# Patient Record
Sex: Male | Born: 1970 | Race: Black or African American | Hispanic: No | Marital: Single | Smoking: Former smoker
Health system: Southern US, Community
[De-identification: ages and names within clinical notes are randomized; demographics above are authoritative.]

## PROBLEM LIST (undated history)

## (undated) DIAGNOSIS — E785 Hyperlipidemia, unspecified: Secondary | ICD-10-CM

## (undated) DIAGNOSIS — Z8701 Personal history of pneumonia (recurrent): Secondary | ICD-10-CM

## (undated) DIAGNOSIS — E669 Obesity, unspecified: Secondary | ICD-10-CM

## (undated) DIAGNOSIS — E119 Type 2 diabetes mellitus without complications: Secondary | ICD-10-CM

## (undated) DIAGNOSIS — I10 Essential (primary) hypertension: Secondary | ICD-10-CM

## (undated) DIAGNOSIS — Z87891 Personal history of nicotine dependence: Secondary | ICD-10-CM

## (undated) DIAGNOSIS — I2699 Other pulmonary embolism without acute cor pulmonale: Secondary | ICD-10-CM

## (undated) HISTORY — DX: Hyperlipidemia, unspecified: E78.5

## (undated) HISTORY — DX: Essential (primary) hypertension: I10

## (undated) HISTORY — PX: INCISION AND DRAINAGE: SHX5863

## (undated) HISTORY — DX: Type 2 diabetes mellitus without complications: E11.9

---

## 2014-06-07 ENCOUNTER — Ambulatory Visit: Payer: Self-pay | Admitting: Family Medicine

## 2014-06-26 ENCOUNTER — Ambulatory Visit
Admit: 2014-06-26 | Disposition: A | Discharge status: Home / Self Care | Payer: Self-pay | Attending: Family Medicine | Admitting: Family Medicine

## 2014-07-20 ENCOUNTER — Ambulatory Visit: Payer: Self-pay | Admitting: Cardiovascular Disease

## 2014-08-09 ENCOUNTER — Ambulatory Visit: Payer: Self-pay | Admitting: Cardiovascular Disease

## 2014-08-24 ENCOUNTER — Ambulatory Visit
Admit: 2014-08-24 | Disposition: A | Discharge status: Home / Self Care | Payer: Self-pay | Attending: Family Medicine | Admitting: Family Medicine

## 2014-09-14 ENCOUNTER — Ambulatory Visit: Payer: Self-pay | Admitting: *Deleted

## 2014-10-23 ENCOUNTER — Telehealth: Payer: Self-pay | Admitting: Family Medicine

## 2014-10-23 NOTE — Telephone Encounter (Signed)
Pt needs refill on Losartan and Metformin and strips. Pt has a Librarian, [employer]. Pt uses [facility] on [address]. Pt has appt sched on 11/13/14. Pt states that the Losartan has to actually be called in. Pt wants to be notified when the rx is put in. Ok to leave vm per pt.

## 2014-10-23 NOTE — Telephone Encounter (Signed)
Please have pt call pharmacy to send us a refill request

## 2014-10-24 NOTE — Telephone Encounter (Signed)
Pt contacted, but no answer. LVM stating to call the pharmacy for refill request.

## 2014-11-01 ENCOUNTER — Other Ambulatory Visit: Payer: Self-pay | Admitting: Family Medicine

## 2014-11-01 MED ORDER — LOSARTAN POTASSIUM-HCTZ 100-12.5 MG PO TABS: 1.0000 | ORAL_TABLET | Freq: Every day | ORAL | Status: DC

## 2014-11-01 MED ORDER — METFORMIN HCL 1000 MG PO TABS: 1000.0000 mg | ORAL_TABLET | Freq: Two times a day (BID) | ORAL | Status: DC

## 2014-11-01 NOTE — Telephone Encounter (Signed)
Spoke with pt refilled meds for 1 month

## 2014-11-13 ENCOUNTER — Encounter: Payer: Self-pay | Admitting: Family Medicine

## 2014-11-13 ENCOUNTER — Ambulatory Visit (INDEPENDENT_AMBULATORY_CARE_PROVIDER_SITE_OTHER): Payer: Medicaid Other | Admitting: Family Medicine

## 2014-11-13 VITALS — BP 140/80 | HR 87 | Temp 98.4°F | Resp 18 | Ht 72.0 in | Wt 323.8 lb

## 2014-11-13 DIAGNOSIS — I10 Essential (primary) hypertension: Secondary | ICD-10-CM | POA: Diagnosis not present

## 2014-11-13 DIAGNOSIS — E119 Type 2 diabetes mellitus without complications: Secondary | ICD-10-CM | POA: Diagnosis not present

## 2014-11-13 DIAGNOSIS — E785 Hyperlipidemia, unspecified: Secondary | ICD-10-CM

## 2014-11-13 DIAGNOSIS — IMO0002 Reserved for concepts with insufficient information to code with codable children: Secondary | ICD-10-CM | POA: Insufficient documentation

## 2014-11-13 MED ORDER — LOSARTAN POTASSIUM-HCTZ 100-12.5 MG PO TABS: 1.0000 | ORAL_TABLET | Freq: Every day | ORAL | Status: DC

## 2014-11-13 MED ORDER — METFORMIN HCL 1000 MG PO TABS: 1000.0000 mg | ORAL_TABLET | Freq: Two times a day (BID) | ORAL | Status: DC

## 2014-11-13 MED ORDER — ATORVASTATIN CALCIUM 10 MG PO TABS: 10.0000 mg | ORAL_TABLET | Freq: Every day | ORAL | Status: DC

## 2014-11-13 NOTE — Progress Notes (Signed)
Name: [name]   MRN: [id number]    DOB: Mar 30, 1971   Date:11/13/2014       Progress Note  Subjective  Chief Complaint  Chief Complaint  Patient presents with  . Follow-up    BP/ Glucose  . Hypertension  . Diabetes  . Hyperlipidemia    Hypertension This is a chronic problem. The problem is controlled. Pertinent negatives include no blurred vision or chest pain. Risk factors for coronary artery disease include dyslipidemia and obesity. Past treatments include diuretics and angiotensin blockers. The current treatment provides significant improvement. There is no history of angina, kidney disease, CAD/MI or CVA.  Diabetes He presents for his follow-up diabetic visit. He has type 2 diabetes mellitus. His disease course has been stable. Pertinent negatives for diabetes include no blurred vision, no chest pain, no fatigue, no polydipsia and no polyuria. Pertinent negatives for diabetic complications include no CVA. Current diabetic treatment includes oral agent (monotherapy). His weight is stable. He is following a diabetic and generally healthy diet. He has had a previous visit with a dietitian. His breakfast blood glucose is taken between 6-7 am. His breakfast blood glucose range is generally >200 mg/dl. An ACE inhibitor/angiotensin II receptor blocker is being taken. He does not see a podiatrist.Eye exam is not current.  Hyperlipidemia This is a chronic problem. The problem is uncontrolled. Recent lipid tests were reviewed and are high. Exacerbating diseases include diabetes and obesity. He has no history of liver disease. Pertinent negatives include no chest pain, leg pain or myalgias. Current antihyperlipidemic treatment includes statins. The current treatment provides significant improvement of lipids. Risk factors for coronary artery disease include male sex and obesity.      Past Medical History  Diagnosis Date  . Hyperlipidemia   . Hypertension   . Diabetes mellitus without  complication     History reviewed. No pertinent past surgical history.  Family History  Problem Relation Age of Onset  . Hypertension Mother   . Diabetes Mother   . Diabetes Maternal Grandmother     History   Social History  . Marital Status: Single    Spouse Name: N/A  . Number of Children: N/A  . Years of Education: N/A   Occupational History  . Not on file.   Social History Main Topics  . Smoking status: Current Some Day Smoker    Types: Cigarettes  . Smokeless tobacco: Never Used     Comment: occasional  . Alcohol Use: No  . Drug Use: No  . Sexual Activity:    Partners: Female   Other Topics Concern  . Not on file   Social History Narrative  . No narrative on file     Current outpatient prescriptions:  .  atorvastatin (LIPITOR) 10 MG tablet, Take by mouth., Disp: , Rfl:  .  HYDROcodone-acetaminophen (NORCO/VICODIN) 5-325 MG per tablet, Take by mouth., Disp: , Rfl:  .  losartan-hydrochlorothiazide (HYZAAR) 100-12.5 MG per tablet, Take 1 tablet by mouth daily., Disp: 30 tablet, Rfl: 0 .  metFORMIN (GLUCOPHAGE) 1000 MG tablet, Take 1 tablet (1,000 mg total) by mouth 2 (two) times daily with a meal., Disp: 60 tablet, Rfl: 0  No Known Allergies   Review of Systems  Constitutional: Negative for fatigue.  Eyes: Negative for blurred vision.  Cardiovascular: Negative for chest pain.  Musculoskeletal: Negative for myalgias.  Endo/Heme/Allergies: Negative for polydipsia.      Objective  Filed Vitals:   11/13/14 0823  BP: 140/80  Pulse: 87  Temp: 98.4 F (36.9 C)  TempSrc: Oral  Resp: 18  Height: 6' (1.829 m)  Weight: 323 lb 12.8 oz (146.875 kg)  SpO2: 96%    Physical Exam  Constitutional: He is well-developed, well-nourished, and in no distress.  Cardiovascular: Normal rate and regular rhythm.   Pulmonary/Chest: Effort normal and breath sounds normal.  Abdominal: Soft. Bowel sounds are normal.  Nursing note and vitals  reviewed.    Assessment & Plan 1. Essential hypertension Blood pressure now well controlled and at goal of less than 140/90. Continue present management - losartan-hydrochlorothiazide (HYZAAR) 100-12.5 MG per tablet; Take 1 tablet by mouth daily.  Dispense: 90 tablet; Refill: 0  2. Diabetes mellitus without complication Fasting blood glucose readings are still poorly controlled. Recheck A1c and consider addition of second oral agen - metFORMIN (GLUCOPHAGE) 1000 MG tablet; Take 1 tablet (1,000 mg total) by mouth 2 (two) times daily with a meal.  Dispense: 180 tablet; Refill: 1 - HgB A1c - Urine Microalbumin w/creat. ratio - Ambulatory referral to Podiatry  3. Hyperlipidemia Recheck lipid panel and if not at goal, consider increasing the dosage of Lipitor - atorvastatin (LIPITOR) 10 MG tablet; Take 1 tablet (10 mg total) by mouth daily at 6 PM.  Dispense: 90 tablet; Refill: 0 - Lipid Profile - Comprehensive metabolic panel   Ulrick Methot Asad [facility] 11/13/2014 8:32 AM

## 2015-02-13 ENCOUNTER — Ambulatory Visit: Payer: Medicaid Other | Admitting: Family Medicine

## 2015-03-15 ENCOUNTER — Telehealth: Payer: Self-pay | Admitting: Family Medicine

## 2015-03-15 DIAGNOSIS — I10 Essential (primary) hypertension: Secondary | ICD-10-CM

## 2015-03-15 DIAGNOSIS — E785 Hyperlipidemia, unspecified: Secondary | ICD-10-CM

## 2015-03-15 DIAGNOSIS — E119 Type 2 diabetes mellitus without complications: Secondary | ICD-10-CM

## 2015-03-15 MED ORDER — METFORMIN HCL 1000 MG PO TABS: 1000.0000 mg | ORAL_TABLET | Freq: Two times a day (BID) | ORAL | Status: DC

## 2015-03-15 MED ORDER — LOSARTAN POTASSIUM-HCTZ 100-12.5 MG PO TABS: 1.0000 | ORAL_TABLET | Freq: Every day | ORAL | Status: DC

## 2015-03-15 MED ORDER — ATORVASTATIN CALCIUM 10 MG PO TABS: 10.0000 mg | ORAL_TABLET | Freq: Every day | ORAL | Status: DC

## 2015-03-15 NOTE — Telephone Encounter (Signed)
Pt would [facility] [address] Pt was last seen in July and does not have a future appt and was advised to schedule an appt and pt refused to due to his work schedule. Please advise.

## 2015-03-15 NOTE — Telephone Encounter (Signed)
[facility] [address] patient has appointment scheduled for 04/12/2015

## 2015-04-12 ENCOUNTER — Encounter: Payer: Self-pay | Admitting: Family Medicine

## 2015-04-12 ENCOUNTER — Ambulatory Visit (INDEPENDENT_AMBULATORY_CARE_PROVIDER_SITE_OTHER): Payer: Medicaid Other | Admitting: Family Medicine

## 2015-04-12 VITALS — BP 138/78 | HR 80 | Temp 98.6°F | Resp 15 | Ht 72.0 in | Wt 339.8 lb

## 2015-04-12 DIAGNOSIS — I10 Essential (primary) hypertension: Secondary | ICD-10-CM | POA: Diagnosis not present

## 2015-04-12 DIAGNOSIS — E785 Hyperlipidemia, unspecified: Secondary | ICD-10-CM

## 2015-04-12 DIAGNOSIS — E119 Type 2 diabetes mellitus without complications: Secondary | ICD-10-CM | POA: Diagnosis not present

## 2015-04-12 LAB — GLUCOSE, POCT (MANUAL RESULT ENTRY): POC Glucose: 209 mg/dl — AB (ref 70–99)

## 2015-04-12 LAB — POCT GLYCOSYLATED HEMOGLOBIN (HGB A1C): Hemoglobin A1C: 8.2

## 2015-04-12 MED ORDER — LOSARTAN POTASSIUM-HCTZ 100-12.5 MG PO TABS: 1.0000 | ORAL_TABLET | Freq: Every day | ORAL | Status: DC

## 2015-04-12 NOTE — Progress Notes (Signed)
Name: [name]   MRN: [id number]    DOB: Feb 04, 1971   Date:04/12/2015       Progress Note  Subjective  Chief Complaint  Chief Complaint  Patient presents with  . Medication Refill    losartan   . Diabetes  . Hyperlipidemia  . Hypertension    Diabetes He presents for his follow-up diabetic visit. He has type 2 diabetes mellitus. His disease course has been stable. Pertinent negatives for hypoglycemia include no headaches or sweats. Pertinent negatives for diabetes include no blurred vision, no chest pain, no fatigue, no polydipsia and no polyuria. Symptoms are improving. Pertinent negatives for diabetic complications include no CVA. His weight is stable. Frequency home blood tests: Pt. not checking his Blood Glucose. An ACE inhibitor/angiotensin II receptor blocker is being taken.  Hyperlipidemia This is a chronic problem. The problem is uncontrolled. Recent lipid tests were reviewed and are high. Pertinent negatives include no chest pain or shortness of breath. Current antihyperlipidemic treatment includes statins.  Hypertension This is a chronic problem. The problem is unchanged. The problem is controlled. Pertinent negatives include no blurred vision, chest pain, headaches, palpitations, shortness of breath or sweats. Past treatments include angiotensin blockers and diuretics. There is no history of kidney disease, CAD/MI or CVA.     Past Medical History  Diagnosis Date  . Hyperlipidemia   . Hypertension   . Diabetes mellitus without complication (HCC)     History reviewed. No pertinent past surgical history.  Family History  Problem Relation Age of Onset  . Hypertension Mother   . Diabetes Mother   . Diabetes Maternal Grandmother     Social History   Social History  . Marital Status: Single    Spouse Name: N/A  . Number of Children: N/A  . Years of Education: N/A   Occupational History  . Not on file.   Social History Main Topics  . Smoking status:  Current Some Day Smoker    Types: Cigarettes  . Smokeless tobacco: Never Used     Comment: occasional  . Alcohol Use: No  . Drug Use: No  . Sexual Activity:    Partners: Female   Other Topics Concern  . Not on file   Social History Narrative     Current outpatient prescriptions:  .  atorvastatin (LIPITOR) 10 MG tablet, Take 1 tablet (10 mg total) by mouth daily at 6 PM., Disp: 30 tablet, Rfl: 0 .  HYDROcodone-acetaminophen (NORCO/VICODIN) 5-325 MG per tablet, Take by mouth. Reported on 04/12/2015, Disp: , Rfl:  .  losartan-hydrochlorothiazide (HYZAAR) 100-12.5 MG tablet, Take 1 tablet by mouth daily., Disp: 30 tablet, Rfl: 0 .  metFORMIN (GLUCOPHAGE) 1000 MG tablet, Take 1 tablet (1,000 mg total) by mouth 2 (two) times daily with a meal., Disp: 180 tablet, Rfl: 0  No Known Allergies   Review of Systems  Constitutional: Negative for fever, chills and fatigue.  Eyes: Negative for blurred vision.  Respiratory: Negative for shortness of breath.   Cardiovascular: Negative for chest pain and palpitations.  Neurological: Negative for headaches.  Endo/Heme/Allergies: Negative for polydipsia.    Objective  Filed Vitals:   04/12/15 0924  BP: 138/78  Pulse: 80  Temp: 98.6 F (37 C)  TempSrc: Oral  Resp: 15  Height: 6' (1.829 m)  Weight: 339 lb 12.8 oz (154.132 kg)  SpO2: 96%    Physical Exam  Constitutional: He is oriented to person, place, and time and well-developed, well-nourished, and in no distress.  HENT:  Head: Normocephalic and atraumatic.  Cardiovascular: Normal rate and regular rhythm.   No murmur heard. Pulmonary/Chest: Effort normal and breath sounds normal. He has no wheezes.  Abdominal: Soft. Bowel sounds are normal.  Neurological: He is alert and oriented to person, place, and time.  Nursing note and vitals reviewed.    Recent Results (from the past 2160 hour(s))  POCT HgB A1C     Status: Abnormal   Collection Time: 04/12/15  9:31 AM  Result Value  Ref Range   Hemoglobin A1C 8.2   POCT Glucose (CBG)     Status: Abnormal   Collection Time: 04/12/15  9:31 AM  Result Value Ref Range   POC Glucose 209 (A) 70 - 99 mg/dl     Assessment & Plan  1. Type 2 diabetes mellitus without complication, without long-term current use of insulin (HCC) Improving steadily. A1c down from 10.2% 8.2%. Advised to continue with current treatment plan. - POCT HgB A1C - POCT Glucose (CBG)  2. Essential hypertension  - losartan-hydrochlorothiazide (HYZAAR) 100-12.5 MG tablet; Take 1 tablet by mouth daily.  Dispense: 90 tablet; Refill: 0  3. Hyperlipidemia  - Lipid Profile    Chaela Branscum Asad A. [facility] 04/12/2015 9:40 AM

## 2015-07-12 ENCOUNTER — Ambulatory Visit: Payer: Medicaid Other | Admitting: Family Medicine

## 2015-08-05 ENCOUNTER — Ambulatory Visit: Payer: Medicaid Other | Admitting: Family Medicine

## 2016-08-07 ENCOUNTER — Ambulatory Visit (INDEPENDENT_AMBULATORY_CARE_PROVIDER_SITE_OTHER): Payer: BLUE CROSS/BLUE SHIELD | Admitting: Family Medicine

## 2016-08-07 ENCOUNTER — Encounter: Payer: Self-pay | Admitting: Family Medicine

## 2016-08-07 VITALS — BP 136/81 | HR 94 | Temp 97.7°F | Resp 17 | Ht 72.0 in | Wt 338.8 lb

## 2016-08-07 DIAGNOSIS — I10 Essential (primary) hypertension: Secondary | ICD-10-CM

## 2016-08-07 DIAGNOSIS — E782 Mixed hyperlipidemia: Secondary | ICD-10-CM | POA: Diagnosis not present

## 2016-08-07 DIAGNOSIS — E1165 Type 2 diabetes mellitus with hyperglycemia: Secondary | ICD-10-CM | POA: Diagnosis not present

## 2016-08-07 DIAGNOSIS — IMO0001 Reserved for inherently not codable concepts without codable children: Secondary | ICD-10-CM

## 2016-08-07 LAB — COMPLETE METABOLIC PANEL WITH GFR
ALT: 30 U/L (ref 9–46)
AST: 18 U/L (ref 10–40)
Albumin: 4.2 g/dL (ref 3.6–5.1)
Alkaline Phosphatase: 77 U/L (ref 40–115)
BILIRUBIN TOTAL: 0.3 mg/dL (ref 0.2–1.2)
BUN: 13 mg/dL (ref 7–25)
CO2: 28 mmol/L (ref 20–31)
CREATININE: 1.01 mg/dL (ref 0.60–1.35)
Calcium: 9.4 mg/dL (ref 8.6–10.3)
Chloride: 102 mmol/L (ref 98–110)
GFR, Est African American: >89 mL/min (ref >=60)
GFR, Est Non African American: >89 mL/min (ref >=60)
GLUCOSE: 186 mg/dL — AB (ref 65–99)
Potassium: 4.3 mmol/L (ref 3.5–5.3)
SODIUM: 136 mmol/L (ref 135–146)
TOTAL PROTEIN: 7.1 g/dL (ref 6.1–8.1)

## 2016-08-07 LAB — MICROALBUMIN / CREATININE URINE RATIO
CREATININE, URINE: 112 mg/dL (ref 20–370)
Microalb Creat Ratio: 13 mcg/mg creat (ref <30)
Microalb, Ur: 1.5 mg/dL

## 2016-08-07 LAB — POCT UA - MICROALBUMIN
ALBUMIN/CREATININE RATIO, URINE, POC: NEGATIVE
CREATININE, POC: NEGATIVE mg/dL
Microalbumin Ur, POC: NEGATIVE mg/L

## 2016-08-07 LAB — LIPID PANEL
Cholesterol: 165 mg/dL (ref <200)
HDL: 31 mg/dL — AB (ref >40)
LDL CALC: 78 mg/dL (ref <100)
Total CHOL/HDL Ratio: 5.3 Ratio — ABNORMAL HIGH (ref <5.0)
Triglycerides: 278 mg/dL — ABNORMAL HIGH (ref <150)
VLDL: 56 mg/dL — AB (ref <30)

## 2016-08-07 LAB — GLUCOSE, POCT (MANUAL RESULT ENTRY): POC GLUCOSE: 195 mg/dL — AB (ref 70–99)

## 2016-08-07 LAB — POCT GLYCOSYLATED HEMOGLOBIN (HGB A1C): Hemoglobin A1C: 9

## 2016-08-07 MED ORDER — LOSARTAN POTASSIUM 50 MG PO TABS: 50.0000 mg | ORAL_TABLET | Freq: Every day | ORAL | 0 refills | Status: DC

## 2016-08-07 MED ORDER — SITAGLIPTIN PHOS-METFORMIN HCL 50-500 MG PO TABS: 1.0000 | ORAL_TABLET | Freq: Two times a day (BID) | ORAL | 0 refills | Status: DC

## 2016-08-07 NOTE — Progress Notes (Signed)
Name: [name]   MRN: [id number]    DOB: 1970/07/19   Date:08/07/2016       Progress Note  Subjective  Chief Complaint  Chief Complaint  Patient presents with  . Annual Exam    CPE    Diabetes  He presents for his follow-up diabetic visit. He has type 2 diabetes mellitus. His disease course has been worsening. Pertinent negatives for hypoglycemia include no headaches or sweats. Associated symptoms include blurred vision (occasional blurry vision when working late at and looking at computer screen). Pertinent negatives for diabetes include no chest pain, no fatigue, no polydipsia and no polyuria. Pertinent negatives for diabetic complications include no CVA, heart disease or peripheral neuropathy. When asked about current treatments, none were reported. His weight is stable. He is following a generally healthy diet. Frequency home blood tests: does not check his blood glucose at home. An ACE inhibitor/angiotensin II receptor blocker is being taken.  Hyperlipidemia  This is a chronic problem. The problem is uncontrolled. Recent lipid tests were reviewed and are high. Pertinent negatives include no chest pain, leg pain, myalgias or shortness of breath. Current antihyperlipidemic treatment includes diet change. Risk factors for coronary artery disease include dyslipidemia.  Hypertension  This is a chronic problem. The problem is unchanged. The problem is controlled. Associated symptoms include blurred vision (occasional blurry vision when working late at and looking at computer screen). Pertinent negatives include no chest pain, headaches, orthopnea, palpitations, shortness of breath or sweats. Past treatments include nothing. There is no history of kidney disease, CAD/MI or CVA.   Patient has not been able to see [place] for a long time because of insurance difficulties, has not been taking his medications that were prescribed for diabetes and hypertension.  Past Medical History:  Diagnosis Date   . Diabetes mellitus without complication (HCC)   . Hyperlipidemia   . Hypertension     History reviewed. No pertinent surgical history.  Family History  Problem Relation Age of Onset  . Hypertension Mother   . Diabetes Mother   . Diabetes Maternal Grandmother     Social History   Social History  . Marital status: Single    Spouse name: N/A  . Number of children: N/A  . Years of education: N/A   Occupational History  . Not on file.   Social History Main Topics  . Smoking status: Current Some Day Smoker    Types: Cigarettes  . Smokeless tobacco: Never Used     Comment: occasional  . Alcohol use No  . Drug use: No  . Sexual activity: Yes    Partners: Female   Other Topics Concern  . Not on file   Social History Narrative  . No narrative on file     Current Outpatient Prescriptions:  .  atorvastatin (LIPITOR) 10 MG tablet, Take 1 tablet (10 mg total) by mouth daily at 6 PM. (Patient not taking: Reported on 08/07/2016), Disp: 30 tablet, Rfl: 0 .  HYDROcodone-acetaminophen (NORCO/VICODIN) 5-325 MG per tablet, Take by mouth. Reported on 04/12/2015, Disp: , Rfl:  .  losartan-hydrochlorothiazide (HYZAAR) 100-12.5 MG tablet, Take 1 tablet by mouth daily. (Patient not taking: Reported on 08/07/2016), Disp: 90 tablet, Rfl: 0 .  metFORMIN (GLUCOPHAGE) 1000 MG tablet, Take 1 tablet (1,000 mg total) by mouth 2 (two) times daily with a meal. (Patient not taking: Reported on 08/07/2016), Disp: 180 tablet, Rfl: 0  No Known Allergies   Review of Systems  Constitutional: Negative for fatigue.  Eyes: Positive for blurred vision (occasional blurry vision when working late at and looking at computer screen).  Respiratory: Negative for shortness of breath.   Cardiovascular: Negative for chest pain, palpitations and orthopnea.  Musculoskeletal: Negative for myalgias.  Neurological: Negative for headaches.  Endo/Heme/Allergies: Negative for polydipsia.     Objective  Vitals:    08/07/16 0916  BP: 136/81  Pulse: 94  Resp: 17  Temp: 97.7 F (36.5 C)  TempSrc: Oral  SpO2: 96%  Weight: (!) 338 lb 12.8 oz (153.7 kg)  Height: 6' (1.829 m)    Physical Exam  Constitutional: He is oriented to person, place, and time and well-developed, well-nourished, and in no distress.  HENT:  Head: Normocephalic and atraumatic.  Cardiovascular: Normal rate, regular rhythm and normal heart sounds.   No murmur heard. Pulmonary/Chest: Effort normal and breath sounds normal. He has no wheezes.  Abdominal: Soft. Bowel sounds are normal. There is no tenderness.  Musculoskeletal: He exhibits no edema.  Neurological: He is alert and oriented to person, place, and time.  Psychiatric: Mood, memory, affect and judgment normal.  Nursing note and vitals reviewed.      Recent Results (from the past 2160 hour(s))  POCT HgB A1C     Status: Abnormal   Collection Time: 08/07/16  9:43 AM  Result Value Ref Range   Hemoglobin A1C 9.0   POCT Glucose (CBG)     Status: Abnormal   Collection Time: 08/07/16  9:43 AM  Result Value Ref Range   POC Glucose 195 (A) 70 - 99 mg/dl  POCT UA - Microalbumin     Status: Normal   Collection Time: 08/07/16  9:45 AM  Result Value Ref Range   Microalbumin Ur, POC NEG mg/L   Creatinine, POC NEG mg/dL   Albumin/Creatinine Ratio, Urine, POC NEG      Assessment & Plan  1. Uncontrolled type 2 diabetes mellitus without complication, without long-term current use of insulin (HCC) Point-of-care A1c is 9%, poorly controlled diabetes, started on Janumet 50-500 milligrams twice a day, encouraged on dietary and lifestyle changes. - POCT HgB A1C - POCT Glucose (CBG) - POCT UA - Microalbumin - Urine Microalbumin w/creat. ratio - sitaGLIPtin-metformin (JANUMET) 50-500 MG tablet; Take 1 tablet by mouth 2 (two) times daily with a meal.  Dispense: 180 tablet; Refill: 0 - Ambulatory referral to Ophthalmology  2. Mixed hyperlipidemia Obtain FLP, consider starting  on statin therapy for primary prevention - COMPLETE METABOLIC PANEL WITH GFR - Lipid panel  3. Essential hypertension DC hydrochlorothiazide, was started on losartan 50 mg daily for renal protection and hypertension. Reassess in 3 months - losartan (COZAAR) 50 MG tablet; Take 1 tablet (50 mg total) by mouth daily.  Dispense: 90 tablet; Refill: 0   Cahlil Sattar [facility]ad [facility] 08/07/2016 10:04 AM

## 2016-08-19 ENCOUNTER — Telehealth: Payer: Self-pay

## 2016-08-19 MED ORDER — ROSUVASTATIN CALCIUM 5 MG PO TABS: 5.0000 mg | ORAL_TABLET | Freq: Every day | ORAL | 0 refills | Status: DC

## 2016-08-19 NOTE — Telephone Encounter (Signed)
Patient has been notified of lab results and a prescription for rosuvastatin 5 mg by mouth daily #90 has been sent to [facility] [address] per Dr. Sherryll Burger, patient has been notified and verbalized understanding

## 2016-08-27 ENCOUNTER — Encounter: Payer: Self-pay | Admitting: Family Medicine

## 2016-08-27 ENCOUNTER — Ambulatory Visit (INDEPENDENT_AMBULATORY_CARE_PROVIDER_SITE_OTHER): Payer: BLUE CROSS/BLUE SHIELD | Admitting: Family Medicine

## 2016-08-27 DIAGNOSIS — Z0000 Encounter for general adult medical examination without abnormal findings: Secondary | ICD-10-CM | POA: Insufficient documentation

## 2016-08-27 LAB — CBC WITH DIFFERENTIAL/PLATELET
BASOS PCT: 0 %
Basophils Absolute: 0 cells/uL (ref 0–200)
EOS ABS: 132 {cells}/uL (ref 15–500)
Eosinophils Relative: 2 %
HEMATOCRIT: 40.5 % (ref 38.5–50.0)
Hemoglobin: 13.5 g/dL (ref 13.2–17.1)
LYMPHS ABS: 2970 {cells}/uL (ref 850–3900)
Lymphocytes Relative: 45 %
MCH: 27.3 pg (ref 27.0–33.0)
MCHC: 33.3 g/dL (ref 32.0–36.0)
MCV: 81.8 fL (ref 80.0–100.0)
MONO ABS: 396 {cells}/uL (ref 200–950)
MPV: 9.2 fL (ref 7.5–12.5)
Monocytes Relative: 6 %
NEUTROS ABS: 3102 {cells}/uL (ref 1500–7800)
Neutrophils Relative %: 47 %
PLATELETS: 239 10*3/uL (ref 140–400)
RBC: 4.95 MIL/uL (ref 4.20–5.80)
RDW: 17 % — AB (ref 11.0–15.0)
WBC: 6.6 10*3/uL (ref 3.8–10.8)

## 2016-08-27 LAB — TSH: TSH: 1.9 mIU/L (ref 0.40–4.50)

## 2016-08-27 NOTE — Progress Notes (Signed)
Name: [name]   MRN: [id number]    DOB: 07/20/1970   Date:08/27/2016       Progress Note  Subjective  Chief Complaint  Chief Complaint  Patient presents with  . Follow-up    2 wk    HPI  Pt. Presents for Comprehensive Physical Exam.  He is not due for prostate or colorectal cancer screening at this time  Past Medical History:  Diagnosis Date  . Diabetes mellitus without complication (HCC)   . Hyperlipidemia   . Hypertension     History reviewed. No pertinent surgical history.  Family History  Problem Relation Age of Onset  . Hypertension Mother   . Diabetes Mother   . Diabetes Maternal Grandmother     Social History   Social History  . Marital status: Single    Spouse name: N/A  . Number of children: N/A  . Years of education: N/A   Occupational History  . Not on file.   Social History Main Topics  . Smoking status: Current Some Day Smoker    Types: Cigarettes  . Smokeless tobacco: Never Used     Comment: occasional  . Alcohol use No  . Drug use: No  . Sexual activity: Yes    Partners: Female   Other Topics Concern  . Not on file   Social History Narrative  . No narrative on file     Current Outpatient Prescriptions:  .  HYDROcodone-acetaminophen (NORCO/VICODIN) 5-325 MG per tablet, Take by mouth. Reported on 04/12/2015, Disp: , Rfl:  .  losartan (COZAAR) 50 MG tablet, Take 1 tablet (50 mg total) by mouth daily., Disp: 90 tablet, Rfl: 0 .  rosuvastatin (CRESTOR) 5 MG tablet, Take 1 tablet (5 mg total) by mouth at bedtime., Disp: 90 tablet, Rfl: 0 .  sitaGLIPtin-metformin (JANUMET) 50-500 MG tablet, Take 1 tablet by mouth 2 (two) times daily with a meal., Disp: 180 tablet, Rfl: 0  No Known Allergies   Review of Systems  Constitutional: Positive for malaise/fatigue. Negative for chills, fever and weight loss.  HENT: Negative for congestion and ear pain (sometimes right ear feels full).   Eyes: Negative for blurred vision and double  vision.  Respiratory: Negative for cough and shortness of breath.   Cardiovascular: Negative for chest pain, palpitations and leg swelling.  Gastrointestinal: Positive for constipation (occasional constipation.). Negative for blood in stool, diarrhea, nausea and vomiting.  Genitourinary: Negative for frequency, hematuria and urgency.  Musculoskeletal: Negative for back pain and neck pain.  Neurological: Negative for dizziness and headaches.  Psychiatric/Behavioral: Negative for depression. The patient is not nervous/anxious.      Objective  Vitals:   08/27/16 0933  BP: 138/80  Pulse: (!) 120  Resp: 17  Temp: 98.3 F (36.8 C)  TempSrc: Oral  SpO2: 96%  Weight: (!) 343 lb (155.6 kg)  Height: 6' (1.829 m)    Physical Exam  Constitutional: He is oriented to person, place, and time and well-developed, well-nourished, and in no distress.  HENT:  Head: Normocephalic and atraumatic.  Eyes: Pupils are equal, round, and reactive to light.  Neck: Neck supple.  Cardiovascular: Normal rate, regular rhythm and normal heart sounds.   No murmur heard. Pulmonary/Chest: Effort normal and breath sounds normal. He has no wheezes.  Abdominal: Soft. Bowel sounds are normal. There is no tenderness.  Musculoskeletal: He exhibits no edema.  Neurological: He is alert and oriented to person, place, and time.  Psychiatric: Mood, memory, affect and judgment normal.  Nursing note and vitals reviewed.     Assessment & Plan  1. Annual physical exam Obtain age-appropriate telemetry screenings - CBC with Differential/Platelet - TSH - VITAMIN D 25 Hydroxy (Vit-D Deficiency, Fractures)   Sophina Mitten [facility]ad [facility] 08/27/2016 9:36 AM

## 2016-08-28 LAB — VITAMIN D 25 HYDROXY (VIT D DEFICIENCY, FRACTURES): Vit D, 25-Hydroxy: 21 ng/mL — ABNORMAL LOW (ref 30–100)

## 2016-09-01 ENCOUNTER — Telehealth: Payer: Self-pay

## 2016-09-01 MED ORDER — VITAMIN D (ERGOCALCIFEROL) 1.25 MG (50000 UNIT) PO CAPS: 50000.0000 [IU] | ORAL_CAPSULE | ORAL | 0 refills | Status: DC

## 2016-09-01 NOTE — Telephone Encounter (Signed)
Patient has been notified of lab results and a prescription of vitamin D3 50,000 units take 1 capsule once a week x12 weeks [facility] [address] per Dr. Rae HalstedShah,patient has been notified and verbalized understanding

## 2016-11-09 ENCOUNTER — Ambulatory Visit: Payer: BLUE CROSS/BLUE SHIELD | Admitting: Family Medicine

## 2016-11-13 ENCOUNTER — Ambulatory Visit: Payer: BLUE CROSS/BLUE SHIELD | Admitting: Family Medicine

## 2016-11-25 ENCOUNTER — Other Ambulatory Visit: Payer: Self-pay | Admitting: Family Medicine

## 2016-11-25 DIAGNOSIS — I10 Essential (primary) hypertension: Secondary | ICD-10-CM

## 2019-12-09 ENCOUNTER — Other Ambulatory Visit: Payer: Self-pay

## 2019-12-09 ENCOUNTER — Ambulatory Visit
Admission: RE | Admit: 2019-12-09 | Discharge: 2019-12-09 | Disposition: A | Discharge status: Home / Self Care | Payer: Commercial Managed Care - PPO | Source: Ambulatory Visit | Attending: Emergency Medicine | Admitting: Emergency Medicine

## 2019-12-09 VITALS — BP 171/92 | HR 72 | Temp 98.3°F | Resp 16 | Ht 72.0 in | Wt 300.0 lb

## 2019-12-09 DIAGNOSIS — R590 Localized enlarged lymph nodes: Secondary | ICD-10-CM

## 2019-12-09 DIAGNOSIS — I10 Essential (primary) hypertension: Secondary | ICD-10-CM

## 2019-12-09 DIAGNOSIS — J029 Acute pharyngitis, unspecified: Secondary | ICD-10-CM | POA: Diagnosis not present

## 2019-12-09 DIAGNOSIS — K149 Disease of tongue, unspecified: Secondary | ICD-10-CM

## 2019-12-09 LAB — GROUP A STREP BY PCR: Group A Strep by PCR: NOT DETECTED

## 2019-12-09 MED ORDER — MAGIC MOUTHWASH W/LIDOCAINE: 5.0000 mL | Freq: Four times a day (QID) | ORAL | 0 refills | Status: AC | PRN

## 2019-12-09 NOTE — ED Triage Notes (Signed)
Patient c/o soreness on the back of his tongue that started on Monday.  Patient also reports some soreness when he swallows.  Patient denies fevers.

## 2019-12-09 NOTE — Discharge Instructions (Addendum)
Recommend use Magic Mouthwash with Lidocaine solution- take 1 teaspoon every 6 hours as needed for pain and swelling. May use take oral Ibuprofen 600mg  every 6 hours as needed for swelling and pain. Follow-up with  ENT if symptoms do not improve within 3 to 4 days.

## 2019-12-11 NOTE — ED Provider Notes (Signed)
[facility]    CSN: [id number] Arrival date & time: 12/09/19  1419      History   Chief Complaint Chief Complaint  Patient presents with  . Sore Throat    HPI [name] is a 49 y.o. male.   49 year old male presents with sores and tenderness of his tongue and mouth that started about 5 days ago. He had eaten something hot and thought he had burned his tongue. He saw a raw, red spot in the middle posterior part of his tongue and noticed a harder area ?abscess next to the raw spot. He also noticed his tongue had a white coating on it which would brush off with toothbrushing. He continues to have tenderness in the area and has multiple questions regarding cause and treatments. He has also noticed random muscle cramps in his neck and legs. He denies any fever, nasal congestion, cough or GI symptoms. He has tried using hydrogen peroxide with minimal relief. He indicates that he eats lots of fruits and vegetables and should not be "low" on any vitamin or mineral. He was a former smoker. Other chronic health issues include HTN, hyperlipidemia, and type 2 DM but he has stopped taking all his medicaiton.   The history is provided by the patient.    Past Medical History:  Diagnosis Date  . Diabetes mellitus without complication (HCC)   . Hyperlipidemia   . Hypertension     Patient Active Problem List   Diagnosis Date Noted  . Annual physical exam 08/27/2016  . Hypertension 11/13/2014  . Diabetes mellitus without complication (HCC) 11/13/2014  . Hyperlipidemia 11/13/2014  . Body mass index of 60 or higher 11/13/2014    History reviewed. No pertinent surgical history.     Home Medications    Prior to Admission medications   Medication Sig Start Date End Date Taking? Authorizing Provider  magic mouthwash w/lidocaine SOLN Take 5 mLs by mouth 4 (four) times daily as needed for mouth pain. 12/09/19   Sudie Grumbling, NP  losartan (COZAAR) 50 MG tablet Take 1  tablet (50 mg total) by mouth daily. 08/07/16 12/09/19  Ellyn Hack, MD  rosuvastatin (CRESTOR) 5 MG tablet Take 1 tablet (5 mg total) by mouth at bedtime. 08/19/16 12/09/19  Ellyn Hack, MD  sitaGLIPtin-metformin (JANUMET) 50-500 MG tablet Take 1 tablet by mouth 2 (two) times daily with a meal. 08/07/16 12/09/19  Ellyn Hack, MD    Family History Family History  Problem Relation Age of Onset  . Hypertension Mother   . Diabetes Mother   . Diabetes Maternal Grandmother     Social History Social History   Tobacco Use  . Smoking status: Former Smoker    Types: Cigarettes  . Smokeless tobacco: Never Used  . Tobacco comment: occasional  Vaping Use  . Vaping Use: Never used  Substance Use Topics  . Alcohol use: No    Alcohol/week: 0.0 standard drinks  . Drug use: No     Allergies   Patient has no known allergies.   Review of Systems Review of Systems  Constitutional: Negative for activity change, appetite change, chills, fatigue and fever.  HENT: Positive for mouth sores and sore throat (sore tongue and back of throat). Negative for congestion, dental problem, ear pain, facial swelling, nosebleeds, postnasal drip, rhinorrhea, sinus pressure, sinus pain, sneezing and trouble swallowing.   Eyes: Negative for pain, discharge, redness and itching.  Respiratory: Negative for cough,  chest tightness, shortness of breath and wheezing.   Gastrointestinal: Negative for abdominal pain, diarrhea, nausea and vomiting.  Musculoskeletal: Positive for myalgias (neck and lower leg muscle cramps).  Skin: Negative for color change, rash and wound.  Allergic/Immunologic: Negative for environmental allergies, food allergies and immunocompromised state.  Neurological: Negative for dizziness, tremors, seizures, syncope, facial asymmetry, weakness, light-headedness, numbness and headaches.  Hematological: Negative for adenopathy. Does not bruise/bleed easily.     Physical Exam Triage  Vital Signs ED Triage Vitals  Enc Vitals Group     BP 12/09/19 1511 (!) 171/92     Pulse Rate 12/09/19 1511 72     Resp 12/09/19 1511 16     Temp 12/09/19 1511 98.3 F (36.8 C)     Temp Source 12/09/19 1511 Oral     SpO2 12/09/19 1511 98 %     Weight 12/09/19 1507 300 lb (136.1 kg)     Height 12/09/19 1507 6' (1.829 m)     Head Circumference --      Peak Flow --      Pain Score 12/09/19 1507 4     Pain Loc --      Pain Edu? --      Excl. in GC? --    No data found.  Updated Vital Signs BP (!) 171/92 (BP Location: Right Arm)   Pulse 72   Temp 98.3 F (36.8 C) (Oral)   Resp 16   Ht 6' (1.829 m)   Wt 300 lb (136.1 kg)   SpO2 98%   BMI 40.69 kg/m   Visual Acuity Right Eye Distance:   Left Eye Distance:   Bilateral Distance:    Right Eye Near:   Left Eye Near:    Bilateral Near:     Physical Exam Vitals and nursing note reviewed.  Constitutional:      General: He is awake. He is not in acute distress.    Appearance: He is well-developed, well-groomed and overweight. He is not ill-appearing.     Comments: He is sitting comfortably in the exam chair in no acute distress.   HENT:     Head: Normocephalic and atraumatic.     Right Ear: Hearing, tympanic membrane, ear canal and external ear normal.     Left Ear: Hearing, tympanic membrane, ear canal and external ear normal.     Nose: Nose normal.     Right Sinus: No maxillary sinus tenderness or frontal sinus tenderness.     Left Sinus: No maxillary sinus tenderness or frontal sinus tenderness.     Mouth/Throat:     Lips: Pink.     Mouth: Mucous membranes are moist. No oral lesions or angioedema.     Dentition: No gum lesions.     Tongue: Lesions present.     Palate: No mass.     Pharynx: Oropharynx is clear. Uvula midline. No pharyngeal swelling, oropharyngeal exudate, posterior oropharyngeal erythema or uvula swelling.      Comments: 38mm red, round area just right of midline in posterior area of tongue absent of  normal papilae. 29mm round area just lateral to red lesion with firmness and tenderness. No distinct abscess present. No drainage. Minimal white coating on tongue today. No bleeding. No other distinct sores present in mouth.  Posterior pharynx slightly red but no swelling or exudate.  Eyes:     Extraocular Movements: Extraocular movements intact.     Conjunctiva/sclera: Conjunctivae normal.  Cardiovascular:     Rate and Rhythm: Normal  rate and regular rhythm.  Pulmonary:     Effort: Pulmonary effort is normal.  Musculoskeletal:        General: No swelling. Normal range of motion.     Cervical back: Normal range of motion and neck supple. No rigidity.     Right lower leg: No edema.     Left lower leg: No edema.  Lymphadenopathy:     Head:     Right side of head: No submental, submandibular, preauricular or posterior auricular adenopathy.     Left side of head: No submental, submandibular, preauricular or posterior auricular adenopathy.     Cervical: Cervical adenopathy present.     Right cervical: No superficial or posterior cervical adenopathy.    Left cervical: Superficial cervical adenopathy present. No posterior cervical adenopathy.  Skin:    General: Skin is warm and dry.     Capillary Refill: Capillary refill takes less than 2 seconds.     Findings: No erythema or rash.  Neurological:     General: No focal deficit present.     Mental Status: He is alert and oriented to person, place, and time.  Psychiatric:        Attention and Perception: Attention normal.        Mood and Affect: Mood normal.        Speech: Speech normal.        Behavior: Behavior is cooperative.        Thought Content: Thought content normal.        Cognition and Memory: Cognition normal.      UC Treatments / Results  Labs (all labs ordered are listed, but only abnormal results are displayed) Labs Reviewed  GROUP A STREP BY PCR    EKG   Radiology No results found.  Procedures Procedures  (including critical care time)  Medications Ordered in UC Medications - No data to display  Initial Impression / Assessment and Plan / UC Course  I have reviewed the triage vital signs and the nursing notes.  Pertinent labs & imaging results that were available during my care of the patient were reviewed by me and considered in my medical decision making (see chart for details).    Reviewed with patient that he appears to have a small ulcer/irritation on his tongue with firmness next to the lesion. Doubt bacterial infection or abscess. Discussed may be trauma, viral and/or may have a fungal component to symptoms. Patient convinced he has a bacterial infection and needs antibiotics. Discussed that his strep test is negative and he has no distinct findings indicating a bacterial infection. Reviewed that lymph node swelling can be due to any irritation and does not indicate bacterial infection. Patient continued to ask multiple questions about etiology and appropriate treatment. He was disappointed that we do not do biopsies at Urgent Care. Discussed that he will need to see an ENT for further evaluation and testing if symptoms persist. Recommend start Magic Mouthwash with lidocaine- 1 teaspoon swish and spit out 4 times a day as needed. May take oral Ibuprofen  every 6 hours as needed for swelling and pain. Recommend call [facility] in 3 to 4 days if not improving.  Final Clinical Impressions(s) / UC Diagnoses   Final diagnoses:  Tongue irritation  Left cervical lymphadenopathy  Sore throat  Elevated blood pressure reading with diagnosis of hypertension     Discharge Instructions     Recommend use Magic Mouthwash with Lidocaine solution- take 1 teaspoon every 6 hours  as needed for pain and swelling. May use take oral Ibuprofen 600mg  every 6 hours as needed for swelling and pain. Follow-up with [facility] if symptoms do not improve within 3 to 4 days.     ED Prescriptions     Medication Sig Dispense Auth. Provider   magic mouthwash w/lidocaine SOLN Take 5 mLs by mouth 4 (four) times daily as needed for mouth pain. 120 mL , NP     PDMP not reviewed this encounter.   Sudie Grumbling, NP 12/11/19 [phone]

## 2019-12-19 ENCOUNTER — Encounter: Payer: Self-pay | Admitting: Emergency Medicine

## 2019-12-19 ENCOUNTER — Emergency Department: Payer: Commercial Managed Care - PPO

## 2019-12-19 ENCOUNTER — Inpatient Hospital Stay
Admission: EM | Admit: 2019-12-19 | Discharge: 2020-01-03 | DRG: 177 | Disposition: A | Discharge status: Home / Self Care | Payer: Commercial Managed Care - PPO | Attending: Internal Medicine | Admitting: Internal Medicine

## 2019-12-19 DIAGNOSIS — J1282 Pneumonia due to coronavirus disease 2019: Secondary | ICD-10-CM | POA: Diagnosis present

## 2019-12-19 DIAGNOSIS — I2699 Other pulmonary embolism without acute cor pulmonale: Secondary | ICD-10-CM | POA: Diagnosis present

## 2019-12-19 DIAGNOSIS — D72819 Decreased white blood cell count, unspecified: Secondary | ICD-10-CM | POA: Diagnosis present

## 2019-12-19 DIAGNOSIS — U071 COVID-19: Principal | ICD-10-CM | POA: Diagnosis present

## 2019-12-19 DIAGNOSIS — E876 Hypokalemia: Secondary | ICD-10-CM | POA: Diagnosis present

## 2019-12-19 DIAGNOSIS — I10 Essential (primary) hypertension: Secondary | ICD-10-CM | POA: Diagnosis present

## 2019-12-19 DIAGNOSIS — Z833 Family history of diabetes mellitus: Secondary | ICD-10-CM

## 2019-12-19 DIAGNOSIS — I2693 Single subsegmental pulmonary embolism without acute cor pulmonale: Secondary | ICD-10-CM | POA: Diagnosis not present

## 2019-12-19 DIAGNOSIS — J9600 Acute respiratory failure, unspecified whether with hypoxia or hypercapnia: Secondary | ICD-10-CM | POA: Diagnosis present

## 2019-12-19 DIAGNOSIS — B3781 Candidal esophagitis: Secondary | ICD-10-CM | POA: Diagnosis present

## 2019-12-19 DIAGNOSIS — Z79899 Other long term (current) drug therapy: Secondary | ICD-10-CM

## 2019-12-19 DIAGNOSIS — Z6841 Body Mass Index (BMI) 40.0 and over, adult: Secondary | ICD-10-CM

## 2019-12-19 DIAGNOSIS — Z87891 Personal history of nicotine dependence: Secondary | ICD-10-CM

## 2019-12-19 DIAGNOSIS — Z7984 Long term (current) use of oral hypoglycemic drugs: Secondary | ICD-10-CM

## 2019-12-19 DIAGNOSIS — B370 Candidal stomatitis: Secondary | ICD-10-CM | POA: Diagnosis present

## 2019-12-19 DIAGNOSIS — D735 Infarction of spleen: Secondary | ICD-10-CM | POA: Diagnosis present

## 2019-12-19 DIAGNOSIS — E871 Hypo-osmolality and hyponatremia: Secondary | ICD-10-CM | POA: Diagnosis present

## 2019-12-19 DIAGNOSIS — J9601 Acute respiratory failure with hypoxia: Secondary | ICD-10-CM

## 2019-12-19 DIAGNOSIS — E119 Type 2 diabetes mellitus without complications: Secondary | ICD-10-CM

## 2019-12-19 DIAGNOSIS — E1165 Type 2 diabetes mellitus with hyperglycemia: Secondary | ICD-10-CM | POA: Diagnosis not present

## 2019-12-19 DIAGNOSIS — J982 Interstitial emphysema: Secondary | ICD-10-CM | POA: Diagnosis not present

## 2019-12-19 DIAGNOSIS — E785 Hyperlipidemia, unspecified: Secondary | ICD-10-CM | POA: Diagnosis present

## 2019-12-19 DIAGNOSIS — K120 Recurrent oral aphthae: Secondary | ICD-10-CM

## 2019-12-19 DIAGNOSIS — Z8249 Family history of ischemic heart disease and other diseases of the circulatory system: Secondary | ICD-10-CM

## 2019-12-19 DIAGNOSIS — E869 Volume depletion, unspecified: Secondary | ICD-10-CM | POA: Diagnosis present

## 2019-12-19 DIAGNOSIS — R7989 Other specified abnormal findings of blood chemistry: Secondary | ICD-10-CM | POA: Diagnosis present

## 2019-12-19 DIAGNOSIS — I161 Hypertensive emergency: Secondary | ICD-10-CM | POA: Diagnosis present

## 2019-12-19 DIAGNOSIS — R040 Epistaxis: Secondary | ICD-10-CM | POA: Diagnosis not present

## 2019-12-19 DIAGNOSIS — R079 Chest pain, unspecified: Secondary | ICD-10-CM

## 2019-12-19 HISTORY — DX: COVID-19: U07.1

## 2019-12-19 HISTORY — DX: Obesity, unspecified: E66.9

## 2019-12-19 HISTORY — DX: Personal history of nicotine dependence: Z87.891

## 2019-12-19 LAB — SARS CORONAVIRUS 2 BY RT PCR (HOSPITAL ORDER, PERFORMED IN ~~LOC~~ HOSPITAL LAB): SARS Coronavirus 2: POSITIVE — AB

## 2019-12-19 MED ORDER — ACETAMINOPHEN 500 MG PO TABS
1000.0000 mg | ORAL_TABLET | Freq: Once | ORAL | Status: AC
Start: 2019-12-19 — End: 2019-12-19
  Administered 2019-12-19: 20:00:00 1000 mg via ORAL
  Filled 2019-12-19: qty 2

## 2019-12-19 NOTE — ED Triage Notes (Addendum)
Pt reports fever since last week, loss of taste, nonprod cough; tylenol taken last at 2pm

## 2019-12-19 NOTE — ED Notes (Signed)
Pt placed on 2Lnc due to 89/90% room air sats. Pt improved to 94%

## 2019-12-20 ENCOUNTER — Other Ambulatory Visit: Payer: Self-pay

## 2019-12-20 ENCOUNTER — Encounter: Payer: Self-pay | Admitting: Emergency Medicine

## 2019-12-20 DIAGNOSIS — U071 COVID-19: Secondary | ICD-10-CM | POA: Diagnosis present

## 2019-12-20 DIAGNOSIS — J1282 Pneumonia due to coronavirus disease 2019: Secondary | ICD-10-CM | POA: Diagnosis present

## 2019-12-20 DIAGNOSIS — I161 Hypertensive emergency: Secondary | ICD-10-CM | POA: Diagnosis present

## 2019-12-20 DIAGNOSIS — E871 Hypo-osmolality and hyponatremia: Secondary | ICD-10-CM | POA: Diagnosis present

## 2019-12-20 DIAGNOSIS — J982 Interstitial emphysema: Secondary | ICD-10-CM | POA: Diagnosis not present

## 2019-12-20 DIAGNOSIS — Z87891 Personal history of nicotine dependence: Secondary | ICD-10-CM | POA: Diagnosis not present

## 2019-12-20 DIAGNOSIS — E785 Hyperlipidemia, unspecified: Secondary | ICD-10-CM | POA: Diagnosis present

## 2019-12-20 DIAGNOSIS — B3781 Candidal esophagitis: Secondary | ICD-10-CM | POA: Diagnosis present

## 2019-12-20 DIAGNOSIS — D735 Infarction of spleen: Secondary | ICD-10-CM | POA: Diagnosis present

## 2019-12-20 DIAGNOSIS — E1165 Type 2 diabetes mellitus with hyperglycemia: Secondary | ICD-10-CM | POA: Diagnosis not present

## 2019-12-20 DIAGNOSIS — B370 Candidal stomatitis: Secondary | ICD-10-CM | POA: Diagnosis present

## 2019-12-20 DIAGNOSIS — Z79899 Other long term (current) drug therapy: Secondary | ICD-10-CM | POA: Diagnosis not present

## 2019-12-20 DIAGNOSIS — J9601 Acute respiratory failure with hypoxia: Secondary | ICD-10-CM | POA: Diagnosis present

## 2019-12-20 DIAGNOSIS — Z7984 Long term (current) use of oral hypoglycemic drugs: Secondary | ICD-10-CM | POA: Diagnosis not present

## 2019-12-20 DIAGNOSIS — I10 Essential (primary) hypertension: Secondary | ICD-10-CM | POA: Diagnosis present

## 2019-12-20 DIAGNOSIS — R7989 Other specified abnormal findings of blood chemistry: Secondary | ICD-10-CM | POA: Diagnosis present

## 2019-12-20 DIAGNOSIS — E876 Hypokalemia: Secondary | ICD-10-CM | POA: Diagnosis present

## 2019-12-20 DIAGNOSIS — R040 Epistaxis: Secondary | ICD-10-CM | POA: Diagnosis not present

## 2019-12-20 DIAGNOSIS — E66813 Obesity, class 3: Secondary | ICD-10-CM

## 2019-12-20 DIAGNOSIS — K120 Recurrent oral aphthae: Secondary | ICD-10-CM | POA: Diagnosis present

## 2019-12-20 DIAGNOSIS — E869 Volume depletion, unspecified: Secondary | ICD-10-CM | POA: Diagnosis present

## 2019-12-20 DIAGNOSIS — D72819 Decreased white blood cell count, unspecified: Secondary | ICD-10-CM | POA: Diagnosis present

## 2019-12-20 DIAGNOSIS — Z6841 Body Mass Index (BMI) 40.0 and over, adult: Secondary | ICD-10-CM | POA: Diagnosis not present

## 2019-12-20 DIAGNOSIS — E119 Type 2 diabetes mellitus without complications: Secondary | ICD-10-CM | POA: Diagnosis not present

## 2019-12-20 DIAGNOSIS — I2693 Single subsegmental pulmonary embolism without acute cor pulmonale: Secondary | ICD-10-CM | POA: Diagnosis not present

## 2019-12-20 LAB — CBC WITH DIFFERENTIAL/PLATELET
Abs Immature Granulocytes: 0.02 10*3/uL (ref 0.00–0.07)
Basophils Absolute: 0 10*3/uL (ref 0.0–0.1)
Basophils Relative: 0 %
Eosinophils Absolute: 0 10*3/uL (ref 0.0–0.5)
Eosinophils Relative: 0 %
HCT: 39.8 % (ref 39.0–52.0)
Hemoglobin: 13.5 g/dL (ref 13.0–17.0)
Immature Granulocytes: 1 %
Lymphocytes Relative: 15 %
Lymphs Abs: 0.5 10*3/uL — ABNORMAL LOW (ref 0.7–4.0)
MCH: 28.1 pg (ref 26.0–34.0)
MCHC: 33.9 g/dL (ref 30.0–36.0)
MCV: 82.7 fL (ref 80.0–100.0)
Monocytes Absolute: 0.1 10*3/uL (ref 0.1–1.0)
Monocytes Relative: 2 %
Neutro Abs: 2.9 10*3/uL (ref 1.7–7.7)
Neutrophils Relative %: 82 %
Platelets: 127 10*3/uL — ABNORMAL LOW (ref 150–400)
RBC: 4.81 MIL/uL (ref 4.22–5.81)
RDW: 15 % (ref 11.5–15.5)
WBC: 3.6 10*3/uL — ABNORMAL LOW (ref 4.0–10.5)
nRBC: 0 % (ref 0.0–0.2)

## 2019-12-20 LAB — MAGNESIUM: Magnesium: 2 mg/dL (ref 1.7–2.4)

## 2019-12-20 LAB — URINE DRUG SCREEN, QUALITATIVE (ARMC ONLY)
Amphetamines, Ur Screen: NOT DETECTED
Barbiturates, Ur Screen: NOT DETECTED
Benzodiazepine, Ur Scrn: NOT DETECTED
Cannabinoid 50 Ng, Ur ~~LOC~~: POSITIVE — AB
Cocaine Metabolite,Ur ~~LOC~~: NOT DETECTED
MDMA (Ecstasy)Ur Screen: NOT DETECTED
Methadone Scn, Ur: NOT DETECTED
Opiate, Ur Screen: NOT DETECTED
Phencyclidine (PCP) Ur S: NOT DETECTED
Tricyclic, Ur Screen: NOT DETECTED

## 2019-12-20 LAB — LIPASE, BLOOD: Lipase: 21 U/L (ref 11–51)

## 2019-12-20 LAB — LACTIC ACID, PLASMA: Lactic Acid, Venous: 1.1 mmol/L (ref 0.5–1.9)

## 2019-12-20 LAB — GLUCOSE, CAPILLARY
Glucose-Capillary: 203 mg/dL — ABNORMAL HIGH (ref 70–99)
Glucose-Capillary: 255 mg/dL — ABNORMAL HIGH (ref 70–99)
Glucose-Capillary: 278 mg/dL — ABNORMAL HIGH (ref 70–99)
Glucose-Capillary: 291 mg/dL — ABNORMAL HIGH (ref 70–99)
Glucose-Capillary: 318 mg/dL — ABNORMAL HIGH (ref 70–99)

## 2019-12-20 LAB — PROTIME-INR
INR: 1 (ref 0.8–1.2)
Prothrombin Time: 12.8 seconds (ref 11.4–15.2)

## 2019-12-20 LAB — COMPREHENSIVE METABOLIC PANEL
ALT: 59 U/L — ABNORMAL HIGH (ref 0–44)
AST: 96 U/L — ABNORMAL HIGH (ref 15–41)
Albumin: 3.5 g/dL (ref 3.5–5.0)
Alkaline Phosphatase: 41 U/L (ref 38–126)
Anion gap: 11 (ref 5–15)
BUN: 9 mg/dL (ref 6–20)
CO2: 24 mmol/L (ref 22–32)
Calcium: 8 mg/dL — ABNORMAL LOW (ref 8.9–10.3)
Chloride: 100 mmol/L (ref 98–111)
Creatinine, Ser: 1.06 mg/dL (ref 0.61–1.24)
GFR calc Af Amer: >60 mL/min (ref >60)
GFR calc non Af Amer: >60 mL/min (ref >60)
Glucose, Bld: 203 mg/dL — ABNORMAL HIGH (ref 70–99)
Potassium: 3.3 mmol/L — ABNORMAL LOW (ref 3.5–5.1)
Sodium: 135 mmol/L (ref 135–145)
Total Bilirubin: 1.1 mg/dL (ref 0.3–1.2)
Total Protein: 6.9 g/dL (ref 6.5–8.1)

## 2019-12-20 LAB — MRSA PCR SCREENING: MRSA by PCR: NEGATIVE

## 2019-12-20 LAB — HIV ANTIBODY (ROUTINE TESTING W REFLEX): HIV Screen 4th Generation wRfx: NONREACTIVE

## 2019-12-20 LAB — BRAIN NATRIURETIC PEPTIDE: B Natriuretic Peptide: 22.3 pg/mL (ref 0.0–100.0)

## 2019-12-20 LAB — C-REACTIVE PROTEIN: CRP: 19.4 mg/dL — ABNORMAL HIGH (ref <1.0)

## 2019-12-20 LAB — PROCALCITONIN: Procalcitonin: 0.25 ng/mL

## 2019-12-20 LAB — ABO/RH: ABO/RH(D): O POS

## 2019-12-20 MED ORDER — METHYLPREDNISOLONE SODIUM SUCC 125 MG IJ SOLR
60.0000 mg | Freq: Two times a day (BID) | INTRAMUSCULAR | Status: DC
  Administered 2019-12-20 – 2019-12-26 (×13): 60 mg via INTRAVENOUS
  Filled 2019-12-20 (×13): qty 2

## 2019-12-20 MED ORDER — ADULT MULTIVITAMIN W/MINERALS CH
1.0000 | ORAL_TABLET | Freq: Every day | ORAL | Status: DC
  Administered 2019-12-20 – 2020-01-03 (×15): 1 via ORAL
  Filled 2019-12-20 (×16): qty 1

## 2019-12-20 MED ORDER — AMLODIPINE BESYLATE 5 MG PO TABS
5.0000 mg | ORAL_TABLET | Freq: Every day | ORAL | Status: DC
  Administered 2019-12-20 – 2019-12-27 (×8): 5 mg via ORAL
  Filled 2019-12-20 (×9): qty 1

## 2019-12-20 MED ORDER — LABETALOL HCL 5 MG/ML IV SOLN
5.0000 mg | INTRAVENOUS | Status: DC | PRN
  Administered 2019-12-20 – 2019-12-21 (×3): 5 mg via INTRAVENOUS
  Filled 2019-12-20 (×2): qty 4

## 2019-12-20 MED ORDER — ENOXAPARIN SODIUM 40 MG/0.4ML ~~LOC~~ SOLN
40.0000 mg | Freq: Two times a day (BID) | SUBCUTANEOUS | Status: DC
  Administered 2019-12-20 – 2019-12-27 (×16): 40 mg via SUBCUTANEOUS
  Filled 2019-12-20 (×16): qty 0.4

## 2019-12-20 MED ORDER — SODIUM CHLORIDE 0.9 % IV SOLN
100.0000 mg | Freq: Every day | INTRAVENOUS | Status: AC
  Administered 2019-12-21 – 2019-12-24 (×4): 100 mg via INTRAVENOUS
  Filled 2019-12-20: qty 100
  Filled 2019-12-20: qty 20
  Filled 2019-12-20 (×2): qty 100
  Filled 2019-12-20: qty 20

## 2019-12-20 MED ORDER — ONDANSETRON HCL 4 MG PO TABS: 4.0000 mg | ORAL_TABLET | Freq: Four times a day (QID) | ORAL | Status: DC | PRN

## 2019-12-20 MED ORDER — SODIUM CHLORIDE 0.9% FLUSH
10.0000 mL | Freq: Two times a day (BID) | INTRAVENOUS | Status: DC
  Administered 2019-12-20 – 2020-01-02 (×27): 10 mL via INTRAVENOUS

## 2019-12-20 MED ORDER — INSULIN ASPART 100 UNIT/ML ~~LOC~~ SOLN
0.0000 [IU] | Freq: Three times a day (TID) | SUBCUTANEOUS | Status: DC
  Administered 2019-12-20: 11 [IU] via SUBCUTANEOUS
  Administered 2019-12-20: 3 [IU] via SUBCUTANEOUS
  Administered 2019-12-20: 11 [IU] via SUBCUTANEOUS
  Administered 2019-12-21: 15 [IU] via SUBCUTANEOUS
  Administered 2019-12-21 (×2): 20 [IU] via SUBCUTANEOUS
  Administered 2019-12-22: 11 [IU] via SUBCUTANEOUS
  Administered 2019-12-22: 20 [IU] via SUBCUTANEOUS
  Administered 2019-12-22: 15 [IU] via SUBCUTANEOUS
  Administered 2019-12-23: 7 [IU] via SUBCUTANEOUS
  Administered 2019-12-23 – 2019-12-24 (×3): 11 [IU] via SUBCUTANEOUS
  Administered 2019-12-24: 7 [IU] via SUBCUTANEOUS
  Administered 2019-12-24: 11 [IU] via SUBCUTANEOUS
  Administered 2019-12-25 – 2019-12-26 (×6): 7 [IU] via SUBCUTANEOUS
  Administered 2019-12-27 (×2): 11 [IU] via SUBCUTANEOUS
  Administered 2019-12-27: 4 [IU] via SUBCUTANEOUS
  Administered 2019-12-28: 11 [IU] via SUBCUTANEOUS
  Administered 2019-12-28: 4 [IU] via SUBCUTANEOUS
  Administered 2019-12-28 – 2019-12-29 (×2): 11 [IU] via SUBCUTANEOUS
  Administered 2019-12-29: 4 [IU] via SUBCUTANEOUS
  Administered 2019-12-30: 7 [IU] via SUBCUTANEOUS
  Administered 2019-12-30 (×2): 4 [IU] via SUBCUTANEOUS
  Administered 2019-12-31 (×2): 11 [IU] via SUBCUTANEOUS
  Administered 2019-12-31 – 2020-01-01 (×2): 7 [IU] via SUBCUTANEOUS
  Administered 2020-01-01: 15 [IU] via SUBCUTANEOUS
  Administered 2020-01-02: 11 [IU] via SUBCUTANEOUS
  Administered 2020-01-03: 4 [IU] via SUBCUTANEOUS
  Filled 2019-12-20 (×37): qty 1

## 2019-12-20 MED ORDER — INSULIN ASPART 100 UNIT/ML ~~LOC~~ SOLN
0.0000 [IU] | Freq: Every day | SUBCUTANEOUS | Status: DC
  Administered 2019-12-20: 4 [IU] via SUBCUTANEOUS
  Administered 2019-12-20: 2 [IU] via SUBCUTANEOUS
  Administered 2019-12-21: 4 [IU] via SUBCUTANEOUS
  Administered 2019-12-22: 2 [IU] via SUBCUTANEOUS
  Administered 2019-12-27: 3 [IU] via SUBCUTANEOUS
  Administered 2019-12-29: 2 [IU] via SUBCUTANEOUS
  Administered 2019-12-30: 4 [IU] via SUBCUTANEOUS
  Filled 2019-12-20 (×8): qty 1

## 2019-12-20 MED ORDER — FUROSEMIDE 10 MG/ML IJ SOLN
40.0000 mg | Freq: Once | INTRAMUSCULAR | Status: AC
  Administered 2019-12-20: 40 mg via INTRAVENOUS
  Filled 2019-12-20: qty 4

## 2019-12-20 MED ORDER — SODIUM CHLORIDE 0.9 % IV SOLN
200.0000 mg | Freq: Once | INTRAVENOUS | Status: AC
  Administered 2019-12-20: 200 mg via INTRAVENOUS
  Filled 2019-12-20: qty 200

## 2019-12-20 MED ORDER — ASCORBIC ACID 500 MG PO TABS
500.0000 mg | ORAL_TABLET | Freq: Every day | ORAL | Status: DC
  Administered 2019-12-20 – 2020-01-03 (×15): 500 mg via ORAL
  Filled 2019-12-20 (×15): qty 1

## 2019-12-20 MED ORDER — DEXAMETHASONE SODIUM PHOSPHATE 10 MG/ML IJ SOLN: 6.0000 mg | INTRAMUSCULAR | Status: DC

## 2019-12-20 MED ORDER — HYDROCOD POLST-CPM POLST ER 10-8 MG/5ML PO SUER
5.0000 mL | Freq: Two times a day (BID) | ORAL | Status: DC | PRN
  Administered 2019-12-20 – 2020-01-02 (×8): 5 mL via ORAL
  Filled 2019-12-20 (×8): qty 5

## 2019-12-20 MED ORDER — DEXAMETHASONE SODIUM PHOSPHATE 10 MG/ML IJ SOLN
10.0000 mg | Freq: Once | INTRAMUSCULAR | Status: AC
  Administered 2019-12-20: 10 mg via INTRAVENOUS
  Filled 2019-12-20: qty 1

## 2019-12-20 MED ORDER — SODIUM CHLORIDE 0.9 % IV SOLN: 200.0000 mg | Freq: Once | INTRAVENOUS | Status: DC

## 2019-12-20 MED ORDER — LINAGLIPTIN 5 MG PO TABS
5.0000 mg | ORAL_TABLET | Freq: Every day | ORAL | Status: DC
  Administered 2019-12-20 – 2020-01-03 (×15): 5 mg via ORAL
  Filled 2019-12-20 (×16): qty 1

## 2019-12-20 MED ORDER — ZINC SULFATE 220 (50 ZN) MG PO CAPS
220.0000 mg | ORAL_CAPSULE | Freq: Every day | ORAL | Status: DC
  Administered 2019-12-20 – 2020-01-03 (×15): 220 mg via ORAL
  Filled 2019-12-20 (×15): qty 1

## 2019-12-20 MED ORDER — HYDRALAZINE HCL 25 MG PO TABS
25.0000 mg | ORAL_TABLET | Freq: Four times a day (QID) | ORAL | Status: DC | PRN
  Administered 2019-12-20 – 2019-12-28 (×3): 25 mg via ORAL
  Filled 2019-12-20 (×4): qty 1

## 2019-12-20 MED ORDER — ONDANSETRON HCL 4 MG/2ML IJ SOLN
4.0000 mg | Freq: Four times a day (QID) | INTRAMUSCULAR | Status: DC | PRN
  Administered 2019-12-25 – 2019-12-26 (×2): 4 mg via INTRAVENOUS
  Filled 2019-12-20 (×2): qty 2

## 2019-12-20 MED ORDER — ACETAMINOPHEN 325 MG PO TABS
650.0000 mg | ORAL_TABLET | Freq: Four times a day (QID) | ORAL | Status: DC | PRN
  Administered 2019-12-20 (×2): 650 mg via ORAL
  Filled 2019-12-20 (×3): qty 2

## 2019-12-20 MED ORDER — SODIUM CHLORIDE 0.9 % IV SOLN: 100.0000 mg | Freq: Every day | INTRAVENOUS | Status: DC

## 2019-12-20 MED ORDER — CLONIDINE HCL 0.1 MG PO TABS
0.1000 mg | ORAL_TABLET | Freq: Every day | ORAL | Status: DC
  Administered 2019-12-20 – 2019-12-25 (×6): 0.1 mg via ORAL
  Filled 2019-12-20 (×6): qty 1

## 2019-12-20 MED ORDER — POTASSIUM CHLORIDE CRYS ER 20 MEQ PO TBCR
40.0000 meq | EXTENDED_RELEASE_TABLET | Freq: Once | ORAL | Status: AC
  Administered 2019-12-20: 40 meq via ORAL
  Filled 2019-12-20: qty 2

## 2019-12-20 MED ORDER — BARICITINIB 2 MG PO TABS
4.0000 mg | ORAL_TABLET | Freq: Every day | ORAL | Status: AC
  Administered 2019-12-20 – 2020-01-02 (×14): 4 mg via ORAL
  Filled 2019-12-20 (×16): qty 2

## 2019-12-20 MED ORDER — SODIUM CHLORIDE 0.9 % IV BOLUS
1000.0000 mL | Freq: Once | INTRAVENOUS | Status: AC
  Administered 2019-12-20: 1000 mL via INTRAVENOUS

## 2019-12-20 MED ORDER — ALBUTEROL SULFATE HFA 108 (90 BASE) MCG/ACT IN AERS
2.0000 | INHALATION_SPRAY | Freq: Four times a day (QID) | RESPIRATORY_TRACT | Status: DC
  Administered 2019-12-20 – 2020-01-03 (×51): 2 via RESPIRATORY_TRACT
  Filled 2019-12-20: qty 6.7

## 2019-12-20 MED ORDER — HYDROCODONE-ACETAMINOPHEN 5-325 MG PO TABS: 1.0000 | ORAL_TABLET | ORAL | Status: DC | PRN

## 2019-12-20 MED ORDER — GUAIFENESIN-DM 100-10 MG/5ML PO SYRP
10.0000 mL | ORAL_SOLUTION | ORAL | Status: DC | PRN
  Administered 2019-12-20: 10 mL via ORAL
  Filled 2019-12-20 (×3): qty 10

## 2019-12-20 NOTE — ED Provider Notes (Signed)
[facility] Emergency Department Provider Note  ____________________________________________   First MD Initiated Contact with Patient 12/19/19 2349     (approximate)  I have reviewed the triage vital signs and the nursing notes.   HISTORY  Chief Complaint Fever    HPI [name] is a 49 y.o. male with medical history as listed below who presents for evaluation of about 1 week of cough, shortness of breath, fever, generalized body aches, decreased appetite, loss of smell, loss of taste, decreased oral intake, and general malaise and fatigue.  He has no specific known contacts for COVID-19 but notes that he is unvaccinated.  His wife has had similar but milder symptoms.  He reports that his symptoms have become severe including the cough or shortness of breath.  Exertion makes his symptoms worse and nothing in particular makes him feel better.  He has a sore on his tongue that is very painful that he went to urgent care about a week ago and has been using a Magic mouthwash but it does not seem to be helping.  It is contributing to his inability  to eat or drink enough.  He reports that he stopped smoking a few months ago.        Past Medical History:  Diagnosis Date  . Diabetes mellitus without complication (HCC)   . History of tobacco use   . Hyperlipidemia   . Hypertension   . Obesity     Patient Active Problem List   Diagnosis Date Noted  . Acute respiratory failure due to COVID-19 (HCC) 12/20/2019  . Obesity, Class III, BMI 40-49.9 (morbid obesity) (HCC) 12/20/2019  . Pneumonia due to COVID-19 virus 12/20/2019  . Annual physical exam 08/27/2016  . Hypertension 11/13/2014  . Diabetes mellitus without complication (HCC) 11/13/2014  . Hyperlipidemia 11/13/2014  . Body mass index of 60 or higher 11/13/2014    History reviewed. No pertinent surgical history.  Prior to Admission medications   Medication Sig Start Date End Date Taking?  Authorizing Provider  magic mouthwash w/lidocaine SOLN Take 5 mLs by mouth 4 (four) times daily as needed for mouth pain. 12/09/19   Sudie Grumbling, NP  losartan (COZAAR) 50 MG tablet Take 1 tablet (50 mg total) by mouth daily. 08/07/16 12/09/19  Ellyn Hack, MD  rosuvastatin (CRESTOR) 5 MG tablet Take 1 tablet (5 mg total) by mouth at bedtime. 08/19/16 12/09/19  Ellyn Hack, MD  sitaGLIPtin-metformin (JANUMET) 50-500 MG tablet Take 1 tablet by mouth 2 (two) times daily with a meal. 08/07/16 12/09/19  Ellyn Hack, MD    Allergies Patient has no known allergies.  Family History  Problem Relation Age of Onset  . Hypertension Mother   . Diabetes Mother   . Diabetes Maternal Grandmother     Social History Social History   Tobacco Use  . Smoking status: Former Smoker    Types: Cigarettes  . Smokeless tobacco: Never Used  . Tobacco comment: occasional  Vaping Use  . Vaping Use: Never used  Substance Use Topics  . Alcohol use: No    Alcohol/week: 0.0 standard drinks  . Drug use: No    Review of Systems Constitutional: +fever/chills Eyes: No visual changes. ENT: +sore throat.  Lesion on tongue.  Loss of smell and taste. Cardiovascular: Denies chest pain. Respiratory: Shortness of breath and cough Gastrointestinal: No abdominal pain.  Decreased appetite and oral intake.  Nausea, no vomiting.  No diarrhea.  No  constipation. Genitourinary: Negative for dysuria. Musculoskeletal: Generalized body aches.  Negative for neck pain.  Negative for back pain. Integumentary: Negative for rash. Neurological: No focal weakness or numbness.   ____________________________________________   PHYSICAL EXAM:  VITAL SIGNS: ED Triage Vitals  Enc Vitals Group     BP 12/19/19 1949 (!) 183/77     Pulse Rate 12/19/19 1949 87     Resp 12/19/19 1949 (!) 22     Temp 12/19/19 1949 (!) 102.2 F (39 C)     Temp Source 12/19/19 1949 Oral     SpO2 12/19/19 1949 97 %     Weight 12/19/19  1949 (!) 142.8 kg (314 lb 13.1 oz)     Height 12/19/19 1949 1.829 m (6')     Head Circumference --      Peak Flow --      Pain Score 12/19/19 2013 0     Pain Loc --      Pain Edu? --      Excl. in GC? --     Constitutional: Alert and oriented.  Eyes: Conjunctivae are normal.  Head: Atraumatic. Nose: No congestion/rhinnorhea. Mouth/Throat: Patient is wearing a mask.  On exam the mucous membranes are dry.  He has a crater-like lesion on his tongue that most likely is an aphthous ulcer although that is an unusual location on the top middle of his tongue.  However there is no indication that it is a bacterial infection or requires urgent intervention. Neck: No stridor.  No meningeal signs.   Cardiovascular: Normal rate, regular rhythm. Good peripheral circulation. Grossly normal heart sounds. Respiratory: Slight increased work of breathing, coarse breath sounds, frequent cough. Gastrointestinal: Soft and nontender. No distention.  Musculoskeletal: No lower extremity tenderness nor edema. No gross deformities of extremities. Neurologic:  Normal speech and language. No gross focal neurologic deficits are appreciated.  Skin:  Skin is warm, dry and intact. Psychiatric: Mood and affect are normal. Speech and behavior are normal.  ____________________________________________   LABS (all labs ordered are listed, but only abnormal results are displayed)  Labs Reviewed  SARS CORONAVIRUS 2 BY RT PCR (HOSPITAL ORDER, PERFORMED IN [facility]) - Abnormal; Notable for the following components:      Result Value   SARS Coronavirus 2 POSITIVE (*)    All other components within normal limits  GLUCOSE, CAPILLARY - Abnormal; Notable for the following components:   Glucose-Capillary 203 (*)    All other components within normal limits  CBC WITH DIFFERENTIAL/PLATELET - Abnormal; Notable for the following components:   WBC 3.6 (*)    Platelets 127 (*)    Lymphs Abs 0.5 (*)    All other  components within normal limits  COMPREHENSIVE METABOLIC PANEL - Abnormal; Notable for the following components:   Potassium 3.3 (*)    Glucose, Bld 203 (*)    Calcium 8.0 (*)    AST 96 (*)    ALT 59 (*)    All other components within normal limits  PROCALCITONIN  LACTIC ACID, PLASMA  PROTIME-INR  BRAIN NATRIURETIC PEPTIDE  LIPASE, BLOOD  HIV ANTIBODY (ROUTINE TESTING W REFLEX)  ABO/RH   ____________________________________________  EKG  No indication for emergent EKG ____________________________________________  RADIOLOGY Marylou MccoyI, Antoinett Dorman, personally viewed and evaluated these images (plain radiographs) as part of my medical decision making, as well as reviewing the written report by the radiologist.  ED MD interpretation:  COVID pneumonia.  Official radiology report(s): DG Chest 1 View  Result Date:  12/19/2019 CLINICAL DATA:  Fever, cough, COVID EXAM: CHEST  1 VIEW COMPARISON:  None. FINDINGS: Heart is normal size. Patchy bilateral airspace opacities. No effusions or pneumothorax. No acute bony abnormality. IMPRESSION: Patchy bilateral airspace disease compatible with COVID pneumonia. Electronically Signed   By: Charlett Nose M.D.   On: 12/19/2019 23:20    ____________________________________________   PROCEDURES   Procedure(s) performed (including Critical Care):  .Critical Care Performed by: Loleta Rose, MD Authorized by: Loleta Rose, MD   Critical care provider statement:    Critical care time (minutes):  30   Critical care time was exclusive of:  Separately billable procedures and treating other patients   Critical care was necessary to treat or prevent imminent or life-threatening deterioration of the following conditions:  Respiratory failure   Critical care was time spent personally by me on the following activities:  Development of treatment plan with patient or surrogate, discussions with consultants, evaluation of patient's response to treatment,  examination of patient, obtaining history from patient or surrogate, ordering and performing treatments and interventions, ordering and review of laboratory studies, ordering and review of radiographic studies, pulse oximetry, re-evaluation of patient's condition and review of old charts     ____________________________________________   INITIAL IMPRESSION / MDM / ASSESSMENT AND PLAN / ED COURSE  As part of my medical decision making, I reviewed the following data within the electronic MEDICAL RECORD NUMBER Nursing notes reviewed and incorporated, Labs reviewed , EKG interpreted , Old chart reviewed, Radiograph reviewed , Discussed with admitting physician and Notes from prior ED visits   Differential diagnosis includes, but is not limited to, COVID-19 pneumonia, bacterial /community-acquired pneumonia, PE, other nonspecific viral infection.  The patient tested positive for COVID-19 by PCR and has bilateral extensive opacities consistent with COVID-19/viral pneumonia.  His oxygen saturation was between 89% and 93% at rest.  We ambulated him and he dropped to 86% with a substantial increased work of breathing.  He is back in bed on 2 L of oxygen by nasal cannula.  Lab work is pending.  He will require admission and I am concerned given his comorbidities that he may get worse before he gets better but I will treat empirically with Decadron 10 mg IV and remdesivir.  The patient is not having chest pain and I think the probability of PE is very low and he does not need an emergent CTA chest at this time.  The patient understands and agrees with the plan.     Clinical Course as of Dec 19 356  Wed Dec 20, 2019  0124 Consulting hospitalist for admission.   [CF]  0133 Lactic Acid, Venous: 1.1 [CF]  0137 Mild transaminitis, slightly low potassium, otherwise unremarkable.  I will give potassium 40 mEq by mouth.  Comprehensive metabolic panel(!) [CF]  0140 Lipase: 21 [CF]  0148 Spoke by phone with Dr.  Para March with the hospitalist service. She will admit.   [CF]    Clinical Course User Index [CF] Loleta Rose, MD     ____________________________________________  FINAL CLINICAL IMPRESSION(S) / ED DIAGNOSES  Final diagnoses:  Lower respiratory tract infection due to COVID-19 virus  Acute respiratory failure with hypoxemia (HCC)  Aphthous ulcer of tongue  Volume depletion  Elevated LFTs     MEDICATIONS GIVEN DURING THIS VISIT:  Medications  remdesivir 200 mg in sodium chloride 0.9% 250 mL IVPB (0 mg Intravenous Stopped 12/20/19 0320)    Followed by  remdesivir 100 mg in sodium chloride 0.9 % 100  mL IVPB (has no administration in time range)  insulin aspart (novoLOG) injection 0-20 Units (has no administration in time range)  insulin aspart (novoLOG) injection 0-5 Units (2 Units Subcutaneous Given 12/20/19 0225)  linagliptin (TRADJENTA) tablet 5 mg (has no administration in time range)  enoxaparin (LOVENOX) injection 40 mg (40 mg Subcutaneous Given 12/20/19 0225)  albuterol (VENTOLIN HFA) 108 (90 Base) MCG/ACT inhaler 2 puff (has no administration in time range)  dexamethasone (DECADRON) injection 6 mg (has no administration in time range)  guaiFENesin-dextromethorphan (ROBITUSSIN DM) 100-10 MG/5ML syrup 10 mL (has no administration in time range)  chlorpheniramine-HYDROcodone (TUSSIONEX) 10-8 MG/5ML suspension 5 mL (has no administration in time range)  ascorbic acid (VITAMIN C) tablet 500 mg (has no administration in time range)  zinc sulfate capsule 220 mg (has no administration in time range)  acetaminophen (TYLENOL) tablet 650 mg (has no administration in time range)  ondansetron (ZOFRAN) tablet 4 mg (has no administration in time range)    Or  ondansetron (ZOFRAN) injection 4 mg (has no administration in time range)  HYDROcodone-acetaminophen (NORCO/VICODIN) 5-325 MG per tablet 1-2 tablet (has no administration in time range)  multivitamin with minerals tablet 1 tablet (has  no administration in time range)  baricitinib (OLUMIANT) tablet 4 mg (4 mg Oral Given 12/20/19 0253)  acetaminophen (TYLENOL) tablet 1,000 mg (1,000 mg Oral Given 12/19/19 2019)  dexamethasone (DECADRON) injection 10 mg (10 mg Intravenous Given 12/20/19 0111)  sodium chloride 0.9 % bolus 1,000 mL (0 mLs Intravenous Stopped 12/20/19 0320)  potassium chloride SA (KLOR-CON) CR tablet 40 mEq (40 mEq Oral Given 12/20/19 0158)     ED Discharge Orders    None      *Please note:  [name] was evaluated in Emergency Department on 12/20/2019 for the symptoms described in the history of present illness. He was evaluated in the context of the global COVID-19 pandemic, which necessitated consideration that the patient might be at risk for infection with the SARS-CoV-2 virus that causes COVID-19. Institutional protocols and algorithms that pertain to the evaluation of patients at risk for COVID-19 are in a state of rapid change based on information released by regulatory bodies including the CDC and federal and state organizations. These policies and algorithms were followed during the patient's care in the ED.  Some ED evaluations and interventions may be delayed as a result of limited staffing during and after the pandemic.*  Note:  This document was prepared using Dragon voice recognition software and may include unintentional dictation errors.   Loleta Rose, MD 12/20/19 [phone]

## 2019-12-20 NOTE — ED Notes (Signed)
Pt O2 increased to 6L [place]. Pt noted to be mouth breathing and heavy snoring when asleep. Placed O2 cannula in pt mouth to improve O2 sats while sleeping.

## 2019-12-20 NOTE — ED Notes (Signed)
MD Para March at bedside. Pt had coughing spell and O2 sats went to 83%. Pt spitting up blood and states "my throat feels raw".

## 2019-12-20 NOTE — ED Notes (Signed)
Pt placed on HFNC 13L. Pt O2 improved to 94% when awake.

## 2019-12-20 NOTE — ED Notes (Signed)
Nurse Katie informed of assigned bed

## 2019-12-20 NOTE — Plan of Care (Signed)
  Problem: Education: Goal: Knowledge of risk factors and measures for prevention of condition will improve Outcome: Progressing   

## 2019-12-20 NOTE — Progress Notes (Signed)
[facility] PROGRESS NOTE  Patient: [name] TLX:[id number]   PCP: Ellyn Hack, MD DOB: [birth year]   DOA: 12/19/2019   DOS: 12/20/2019    Subjective: Patient was admitted by my colleague earlier on 12/20/2019. I have reviewed the H&P as well as assessment and plan and agree with the same. Important changes in the plan are listed below.  Objective:  Vitals:   12/20/19 1524 12/20/19 1530  BP:  (!) 220/93  Pulse:    Resp: (!) 35 (!) 41  Temp:    SpO2:      Difficult to auscultate secondary to body habitus. S1-S2 present.  Assessment and plan: Hypertensive urgency Add clonidine, Norvasc, Lasix, hydralazine Check UDS to rule out cocaine.  Patient denies having any substance abuse.  Acute hypoxic respiratory failure secondary to COVID-19 pneumonia Continue current care. IV Lasix once. Patient is on IV remdesivir, steroids, baricitinib.  Changing IV Decadron to Solu-Medrol.  Possible OSA versus obesity hypoventilation syndrome Monitor for now. Outpatient work-up needed.  Hypokalemia. Treated. Monitor.  Elevated LFT. In the setting of COVID-19 prevention monitor.  Leukopenia. In the setting of COVID-19. Monitor.   Author: Lynden Oxford, MD [facility] 12/20/2019 4:11 PM   If 7PM-7AM, please contact night-coverage at [email or web address]

## 2019-12-20 NOTE — Progress Notes (Signed)
Remdesivir - Pharmacy Brief Note   O:  CXR: IMPRESSION: Patchy bilateral airspace disease compatible with COVID pneumonia. SpO2: 93 - 94% on 2L [place]   A/P:  Remdesivir 200 mg IVPB once followed by 100 mg IVPB daily x 4 days.   Thomasene Ripple, PharmD, BCPS Clinical Pharmacist 12/20/2019 12:51 AM

## 2019-12-20 NOTE — ED Notes (Signed)
Pt ambulated in room. Room air sats dropped to 86%. Pt back in bed, placed back on 2L. Now at 94%

## 2019-12-20 NOTE — H&P (Signed)
History and Physical    [name] VEL:[id number] DOB: [birth year] DOA: 12/19/2019  PCP: Scott Hack, MD   Patient coming from: Home  I have personally briefly reviewed patient's old medical records in [facility]  Chief Complaint: Cough, fever, shortness of breath  HPI: [name] is a 49 y.o. male with medical history significant for diabetes, hypertension and obesity, unvaccinated against Covid who presents to the emergency room with a several day history of fever cough and shortness of breath.  His wife has similar but milder symptoms.  Due to progressing symptoms came into the emergency room for evaluation ED Course: On arrival he was febrile at 102.2 tachypneic at 22 with O2 sat 97% on room air but desaturating to 86 with ambulation.  BP 183/77.  Covid test positive, chest x-ray showed patchy bilateral airspace disease compatible with Covid pneumonia.  Patient started on usual treatment, oxygen.  Hospitalist consulted for admission.  Review of Systems: As per HPI otherwise all other systems on review of systems negative.    Past Medical History:  Diagnosis Date  . COVID-19 12/19/2019   Diagnosed at [facility] on 12/19/2019  . Diabetes mellitus without complication (HCC)   . History of tobacco use   . Hyperlipidemia   . Hypertension   . Obesity     History reviewed. No pertinent surgical history.   reports that he has quit smoking. His smoking use included cigarettes. He has never used smokeless tobacco. He reports that he does not drink alcohol and does not use drugs.  No Known Allergies  Family History  Problem Relation Age of Onset  . Hypertension Mother   . Diabetes Mother   . Diabetes Maternal Grandmother       Prior to Admission medications   Medication Sig Start Date End Date Taking? Authorizing Provider  magic mouthwash w/lidocaine SOLN Take 5 mLs by mouth 4 (four) times daily as needed for mouth pain. 12/09/19   Scott Grumbling, NP  losartan  (COZAAR) 50 MG tablet Take 1 tablet (50 mg total) by mouth daily. 08/07/16 12/09/19  Scott Hack, MD  rosuvastatin (CRESTOR) 5 MG tablet Take 1 tablet (5 mg total) by mouth at bedtime. 08/19/16 12/09/19  Scott Hack, MD  sitaGLIPtin-metformin (JANUMET) 50-500 MG tablet Take 1 tablet by mouth 2 (two) times daily with a meal. 08/07/16 12/09/19  Scott Hack, MD    Physical Exam: Vitals:   12/19/19 1949 12/19/19 2225 12/20/19 0100  BP: (!) 183/77 (!) 160/84 (!) 159/89  Pulse: 87 90 83  Resp: (!) 22 (!) 21   Temp: (!) 102.2 F (39 C) 99.6 F (37.6 C)   TempSrc: Oral Oral   SpO2: 97% 93% 93%  Weight: (!) 142.8 kg    Height: 6' (1.829 m)       Vitals:   12/19/19 1949 12/19/19 2225 12/20/19 0100  BP: (!) 183/77 (!) 160/84 (!) 159/89  Pulse: 87 90 83  Resp: (!) 22 (!) 21   Temp: (!) 102.2 F (39 C) 99.6 F (37.6 C)   TempSrc: Oral Oral   SpO2: 97% 93% 93%  Weight: (!) 142.8 kg    Height: 6' (1.829 m)        Constitutional:  Patient looks ill, coughing vigorously, tachypneic and appears anxious  HEENT: Grossly normal but limited given Covid positivity  Cardiovascular:  Tachycardic. No murmurs, gallops, or rubs. 2+ symmetrical distal pulses are present . No JVD. No  LE edema Respiratory: Respiratory effort increased.Lungs sounds with scattered wheezes, crackles, or rhonchi.  Gastrointestinal: Soft, non tender, and non distended with positive bowel sounds. No rebound or guarding. Genitourinary: No CVA tenderness. Musculoskeletal: Nontender with normal range of motion in all extremities. No cyanosis, or erythema of extremities. Neurologic: Normal speech and language. Face is symmetric. Moving all extremities. No gross focal neurologic deficits . Skin: Skin is warm, dry.  No rash or ulcers Psychiatric: Anxious normal  Labs on Admission: I have personally reviewed following labs and imaging studies  CBC: Recent Labs  Lab 12/20/19 0109  WBC 3.6*  NEUTROABS 2.9  HGB  13.5  HCT 39.8  MCV 82.7  PLT 127*   Basic Metabolic Panel: Recent Labs  Lab 12/20/19 0109  NA 135  K 3.3*  CL 100  CO2 24  GLUCOSE 203*  BUN 9  CREATININE 1.06  CALCIUM 8.0*   GFR: Estimated Creatinine Clearance: 126.4 mL/min (by C-G formula based on SCr of 1.06 mg/dL). Liver Function Tests: Recent Labs  Lab 12/20/19 0109  AST 96*  ALT 59*  ALKPHOS 41  BILITOT 1.1  PROT 6.9  ALBUMIN 3.5   Recent Labs  Lab 12/20/19 0109  LIPASE 21   No results for input(s): AMMONIA in the last 168 hours. Coagulation Profile: Recent Labs  Lab 12/20/19 0109  INR 1.0   Cardiac Enzymes: No results for input(s): CKTOTAL, CKMB, CKMBINDEX, TROPONINI in the last 168 hours. BNP (last 3 results) No results for input(s): PROBNP in the last 8760 hours. HbA1C: No results for input(s): HGBA1C in the last 72 hours. CBG: Recent Labs  Lab 12/20/19 0033  GLUCAP 203*   Lipid Profile: No results for input(s): CHOL, HDL, LDLCALC, TRIG, CHOLHDL, LDLDIRECT in the last 72 hours. Thyroid Function Tests: No results for input(s): TSH, T4TOTAL, FREET4, T3FREE, THYROIDAB in the last 72 hours. Anemia Panel: No results for input(s): VITAMINB12, FOLATE, FERRITIN, TIBC, IRON, RETICCTPCT in the last 72 hours. Urine analysis: No results found for: COLORURINE, APPEARANCEUR, LABSPEC, PHURINE, GLUCOSEU, HGBUR, BILIRUBINUR, KETONESUR, PROTEINUR, UROBILINOGEN, NITRITE, LEUKOCYTESUR  Radiological Exams on Admission: DG Chest 1 View  Result Date: 12/19/2019 CLINICAL DATA:  Fever, cough, COVID EXAM: CHEST  1 VIEW COMPARISON:  None. FINDINGS: Heart is normal size. Patchy bilateral airspace opacities. No effusions or pneumothorax. No acute bony abnormality. IMPRESSION: Patchy bilateral airspace disease compatible with COVID pneumonia. Electronically Signed   By: Charlett Nose M.D.   On: 12/19/2019 23:20   EKG with sinus tachycardia  Assessment/Plan 49 year old male with history of diabetes, hypertension and  morbid obesity, unvaccinated against Covid who presents to the emergency room with a several day history of fever cough and shortness of breath.      Pneumonia due to COVID-19 virus, unvaccinated   Acute respiratory failure due to COVID-19 [facility]) -Patient with risk factors for severe Covid, presenting with fever, tachypnea,  O2 sats desaturations to 83% with coughing and movement in bed and Covid typical chest x-ray -Remdesivir, IV Decadron, albuterol, antitussives and vitamins -Baricitinib  Given comorbidities with associated risk for severe disease.  -Oxygen and proning as tolerated -Follow inflammatory biomarkers    Hypertension -Continue home meds pending med rec    Diabetes mellitus without complication (HCC) -Insulin sliding scale coverage and Tradjenta    Obesity, Class III, BMI 40-49.9 (morbid obesity) (HCC) -Complicating factor to overall prognosis and care    DVT prophylaxis: Lovenox  Code Status: full code  Family Communication:  none  Disposition Plan: Back to previous  home environment Consults called: none  Status:At the time of admission, it appears that the appropriate admission status for this patient is INPATIENT. This is judged to be reasonable and necessary in order to provide the required intensity of service to ensure the patient's safety given the presenting symptoms, physical exam findings, and initial radiographic and laboratory data in the context of their  Comorbid conditions.   Patient requires inpatient status due to high intensity of service, high risk for further deterioration and high frequency of surveillance required.   I certify that at the point of admission it is my clinical judgment that the patient will require inpatient hospital care spanning beyond 2 midnights     Andris Baumann MD [facility]     12/20/2019, 1:58 AM

## 2019-12-21 LAB — CBC WITH DIFFERENTIAL/PLATELET
Abs Immature Granulocytes: 0.02 10*3/uL (ref 0.00–0.07)
Basophils Absolute: 0 10*3/uL (ref 0.0–0.1)
Basophils Relative: 0 %
Eosinophils Absolute: 0 10*3/uL (ref 0.0–0.5)
Eosinophils Relative: 0 %
HCT: 44.1 % (ref 39.0–52.0)
Hemoglobin: 14.7 g/dL (ref 13.0–17.0)
Immature Granulocytes: 0 %
Lymphocytes Relative: 11 %
Lymphs Abs: 0.6 10*3/uL — ABNORMAL LOW (ref 0.7–4.0)
MCH: 27.5 pg (ref 26.0–34.0)
MCHC: 33.3 g/dL (ref 30.0–36.0)
MCV: 82.6 fL (ref 80.0–100.0)
Monocytes Absolute: 0.2 10*3/uL (ref 0.1–1.0)
Monocytes Relative: 3 %
Neutro Abs: 4.9 10*3/uL (ref 1.7–7.7)
Neutrophils Relative %: 86 %
Platelets: 159 10*3/uL (ref 150–400)
RBC: 5.34 MIL/uL (ref 4.22–5.81)
RDW: 15.4 % (ref 11.5–15.5)
Smear Review: NORMAL
WBC: 5.7 10*3/uL (ref 4.0–10.5)
nRBC: 0 % (ref 0.0–0.2)

## 2019-12-21 LAB — HEMOGLOBIN A1C
Hgb A1c MFr Bld: 10.4 % — ABNORMAL HIGH (ref 4.8–5.6)
Mean Plasma Glucose: 252 mg/dL

## 2019-12-21 LAB — GLUCOSE, CAPILLARY
Glucose-Capillary: 319 mg/dL — ABNORMAL HIGH (ref 70–99)
Glucose-Capillary: 410 mg/dL — ABNORMAL HIGH (ref 70–99)
Glucose-Capillary: 397 mg/dL — ABNORMAL HIGH (ref 70–99)
Glucose-Capillary: 325 mg/dL — ABNORMAL HIGH (ref 70–99)

## 2019-12-21 LAB — COMPREHENSIVE METABOLIC PANEL
ALT: 73 U/L — ABNORMAL HIGH (ref 0–44)
AST: 148 U/L — ABNORMAL HIGH (ref 15–41)
Albumin: 3.3 g/dL — ABNORMAL LOW (ref 3.5–5.0)
Alkaline Phosphatase: 53 U/L (ref 38–126)
Anion gap: 10 (ref 5–15)
BUN: 15 mg/dL (ref 6–20)
CO2: 24 mmol/L (ref 22–32)
Calcium: 8 mg/dL — ABNORMAL LOW (ref 8.9–10.3)
Chloride: 102 mmol/L (ref 98–111)
Creatinine, Ser: 0.91 mg/dL (ref 0.61–1.24)
GFR calc Af Amer: >60 mL/min (ref >60)
GFR calc non Af Amer: >60 mL/min (ref >60)
Glucose, Bld: 297 mg/dL — ABNORMAL HIGH (ref 70–99)
Potassium: 4 mmol/L (ref 3.5–5.1)
Sodium: 136 mmol/L (ref 135–145)
Total Bilirubin: 0.9 mg/dL (ref 0.3–1.2)
Total Protein: 6.9 g/dL (ref 6.5–8.1)

## 2019-12-21 LAB — FIBRIN DERIVATIVES D-DIMER (ARMC ONLY): Fibrin derivatives D-dimer (ARMC): 2720.97 ng/mL (FEU) — ABNORMAL HIGH (ref 0.00–499.00)

## 2019-12-21 LAB — FERRITIN: Ferritin: 4167 ng/mL — ABNORMAL HIGH (ref 24–336)

## 2019-12-21 LAB — MAGNESIUM: Magnesium: 2 mg/dL (ref 1.7–2.4)

## 2019-12-21 LAB — C-REACTIVE PROTEIN: CRP: 13.5 mg/dL — ABNORMAL HIGH (ref <1.0)

## 2019-12-21 MED ORDER — HYDROCHLOROTHIAZIDE 25 MG PO TABS
25.0000 mg | ORAL_TABLET | Freq: Every day | ORAL | Status: DC
  Administered 2019-12-21 – 2019-12-29 (×9): 25 mg via ORAL
  Filled 2019-12-21 (×9): qty 1

## 2019-12-21 MED ORDER — MELATONIN 5 MG PO TABS
5.0000 mg | ORAL_TABLET | Freq: Every day | ORAL | Status: DC
  Administered 2019-12-21 – 2020-01-01 (×12): 5 mg via ORAL
  Filled 2019-12-21 (×12): qty 1

## 2019-12-21 MED ORDER — CHLORHEXIDINE GLUCONATE 0.12 % MT SOLN
15.0000 mL | Freq: Two times a day (BID) | OROMUCOSAL | Status: DC
  Administered 2019-12-21 – 2020-01-03 (×21): 15 mL via OROMUCOSAL
  Filled 2019-12-21 (×24): qty 15

## 2019-12-21 MED ORDER — SPIRONOLACTONE 25 MG PO TABS
25.0000 mg | ORAL_TABLET | Freq: Every day | ORAL | Status: DC
  Administered 2019-12-21 – 2020-01-03 (×14): 25 mg via ORAL
  Filled 2019-12-21 (×14): qty 1

## 2019-12-21 MED ORDER — SPIRONOLACTONE-HCTZ 25-25 MG PO TABS
1.0000 | ORAL_TABLET | Freq: Every day | ORAL | Status: DC
  Filled 2019-12-21: qty 1

## 2019-12-21 MED ORDER — MAGIC MOUTHWASH W/LIDOCAINE: 15.0000 mL | Freq: Four times a day (QID) | ORAL | Status: DC

## 2019-12-21 MED ORDER — ENSURE MAX PROTEIN PO LIQD
11.0000 [oz_av] | Freq: Three times a day (TID) | ORAL | Status: DC
  Administered 2019-12-21 – 2020-01-02 (×26): 11 [oz_av] via ORAL
  Filled 2019-12-21: qty 330

## 2019-12-21 MED ORDER — MAGIC MOUTHWASH
15.0000 mL | Freq: Four times a day (QID) | ORAL | Status: DC
  Filled 2019-12-21 (×3): qty 20

## 2019-12-21 MED ORDER — LIDOCAINE VISCOUS HCL 2 % MT SOLN
15.0000 mL | Freq: Four times a day (QID) | OROMUCOSAL | Status: DC
  Administered 2019-12-21 – 2019-12-23 (×3): 15 mL via OROMUCOSAL
  Filled 2019-12-21 (×15): qty 15

## 2019-12-21 MED ORDER — LIDOCAINE VISCOUS HCL 2 % MT SOLN
15.0000 mL | Freq: Four times a day (QID) | OROMUCOSAL | Status: DC
  Filled 2019-12-21 (×3): qty 15

## 2019-12-21 MED ORDER — INSULIN DETEMIR 100 UNIT/ML ~~LOC~~ SOLN
14.0000 [IU] | Freq: Two times a day (BID) | SUBCUTANEOUS | Status: DC
  Administered 2019-12-21 – 2019-12-22 (×3): 14 [IU] via SUBCUTANEOUS
  Filled 2019-12-21 (×4): qty 0.14

## 2019-12-21 MED ORDER — MAGIC MOUTHWASH
5.0000 mL | Freq: Four times a day (QID) | ORAL | Status: DC
  Administered 2019-12-21 – 2019-12-25 (×6): 5 mL via ORAL
  Filled 2019-12-21 (×20): qty 10

## 2019-12-21 NOTE — Progress Notes (Signed)
Patient's pulse is being monitored continuously by central telemetry. Patient's pulse ox remains in the 90's while on HFNC and non rebreather at 15L. Without both the patient's pulse ox dropped to 70%.

## 2019-12-21 NOTE — Progress Notes (Signed)
Inpatient Diabetes Program Recommendations  AACE/ADA: New Consensus Statement on Inpatient Glycemic Control (2015)  Target Ranges:  Prepandial:   less than 140 mg/dL      Peak postprandial:   less than 180 mg/dL (1-2 hours)      Critically ill patients:  140 - 180 mg/dL   Lab Results  Component Value Date   GLUCAP 319 (H) 12/21/2019   HGBA1C 10.4 (H) 12/20/2019    Review of Glycemic Control Results for STEPHEN, BARUCH (MRN [id number]) as of 12/21/2019 09:29  Ref. Range 12/20/2019 07:58 12/20/2019 12:11 12/20/2019 17:31 12/20/2019 21:12 12/21/2019 07:59  Glucose-Capillary Latest Ref Range: 70 - 99 mg/dL 035 (H) 465 (H) 681 (H) 318 (H) 319 (H)   Diabetes history: DM2 Outpatient Diabetes medications: none Current orders for Inpatient glycemic control: Novolog resistant tid + hs 0-5 units + Tradjenta 5 mg  Inpatient Diabetes Program Recommendations:   -Levemir 14 units bid (0.2 units/kg x 140.9 kg) -Novolog 5 units tid meal coverage if eats 50% meal Will plan to speak with patient during hospitalization regarding A1c of 10.4 (average blood glucose of 252 over the past 2-3 months)  Thank you, Darel Hong E. Marguriete Wootan, RN, MSN, CDE  Diabetes Coordinator Inpatient Glycemic Control Team Team Pager [phone] (8am-5pm) 12/21/2019 9:41 AM

## 2019-12-21 NOTE — Progress Notes (Signed)
[facility] Progress Note  Patient: [name]    WEX:[id number]  DOA: 12/19/2019     Date of Service: the patient was seen and examined on 12/21/2019  Brief hospital course: Past medical history of morbid obesity. Patient does not take any medications for diabetes or hypertension. Presents with shortness of breath and found to have COVID-19 pneumonia as well as hypertensive urgency. Currently plan is continue current care.  Assessment and Plan: 1. Acute hypoxic respiratory failure Acute COVID-19 Viral Pneumonia CXR: hazy bilateral peripheral opacities Tmax last 24 hours:  Temp (24hrs), Avg:98.5 F (36.9 C), Min:98 F (36.7 C), Max:99.2 F (37.3 C)  Oxygen requirements: FiO2 (%):  [100 %] 100 % may require heated high flow. CRP: 13.5 Remdesivir: Started on 12/19/2019 Steroids: Started on 12/19/2019 with Decadron. Currently on Solu-Medrol 60 mg twice daily.  Baricitinib/Actemra(off-label use): Also started on 12/19/2019. The investigational nature of this medication was discussed with the patient/HCPOA and they choose to proceed as the potential benefits are felt to outweigh risks at this time.  Antibiotics:none Vitamin C and Zinc: continue DVT Prophylaxis: enoxaparin (LOVENOX) injection 40 mg Start: 12/20/19 0215 Prone positioning: Patient encouraged to stay in prone position as much as possible.  The treatment plan and use of medications and known side effects were discussed with patient/family. It was clearly explained that Complete risks and long-term side effects are unknown, however in the best clinical judgment they seem to be of some clinical benefit rather than medical risks. Patient/family agree with the treatment plan and want to receive these treatments as indicated.   2. Hypertensive urgency Blood pressure significantly elevated, Beyond 200 systolic. Continue current regimen of clonidine, Norvasc, HCTZ and Aldactone as well as as needed hydralazine. Monitor  electrolytes.  3. Hypokalemia Corrected. Monitor.  4. Type 2 diabetes mellitus, uncontrolled with hyperglycemia Sugar significantly elevated. A1c 10.5. Continue current regimen for now. Patient has been informed that he will be discharged with insulin given his high A1c.  5. Elevated LFT In the setting of COVID-19. Monitor.  6. Leukopenia In the setting of COVID-19. Monitor.  7. Obesity, obesity hypoventilation syndrome versus OSA Putting the patient at high risk for worsening respiratory illness from COVID-19. Monitor. Body mass index is 42.13 kg/m.   8. Oral ulcer Patient has seen urgent care for the same. Continuing chlorhexidine and Magic mouthwash with lidocaine.  Diet: Cardiac and carb modified diet DVT Prophylaxis:   enoxaparin (LOVENOX) injection 40 mg Start: 12/20/19 0215    Advance goals of care discussion: Full code  Family Communication: no family was present at bedside, at the time of interview.   Disposition:  Status is: Inpatient  Remains inpatient appropriate because:IV treatments appropriate due to intensity of illness or inability to take PO   Dispo: The patient is from: Home              Anticipated d/c is to: Home              Anticipated d/c date is: > 3 days              Patient currently is not medically stable to d/c.        Subjective: No nausea no vomiting. No fever no chills. Continues to have shortness of breath. Reports and also redness tongue. Difficulty swallowing due to sore throat.  Physical Exam:  General: Appear in moderate distress, no Rash; Oral Mucosa Clear, moist. no Abnormal Neck Mass Or lumps, Conjunctiva normal  Cardiovascular: S1 and S2  Present, no Murmur, Respiratory: increased respiratory effort, Bilateral Air entry present and bilateral  Crackles, no wheezes Abdomen: Bowel Sound present, Soft and no tenderness Extremities: no Pedal edema, nop calf tenderness Neurology: alert and oriented to time, place, and  person affect appropriate. no new focal deficit Gait not checked due to patient safety concerns  Vitals:   12/21/19 1141 12/21/19 1429 12/21/19 1538 12/21/19 1712  BP: (!) 167/96  (!) 198/93 (!) 178/93  Pulse: 84  85 82  Resp: 18  18   Temp: 98.1 F (36.7 C)  99.2 F (37.3 C)   TempSrc: Oral  Oral   SpO2: 96% 96% 97% 93%  Weight:      Height:        Intake/Output Summary (Last 24 hours) at 12/21/2019 1856 Last data filed at 12/21/2019 0442 Gross per 24 hour  Intake --  Output 200 ml  Net -200 ml   Filed Weights   12/19/19 1949 12/21/19 0440  Weight: (!) 142.8 kg (!) 140.9 kg    Data Reviewed: I have personally reviewed and interpreted daily labs, tele strips, imagings as discussed above. I reviewed all nursing notes, pharmacy notes, vitals, pertinent old records I have discussed plan of care as described above with RN and patient/family.  CBC: Recent Labs  Lab 12/20/19 0109 12/21/19 0456  WBC 3.6* 5.7  NEUTROABS 2.9 4.9  HGB 13.5 14.7  HCT 39.8 44.1  MCV 82.7 82.6  PLT 127* 159   Basic Metabolic Panel: Recent Labs  Lab 12/20/19 0109 12/21/19 0456  NA 135 136  K 3.3* 4.0  CL 100 102  CO2 24 24  GLUCOSE 203* 297*  BUN 9 15  CREATININE 1.06 0.91  CALCIUM 8.0* 8.0*  MG 2.0 2.0    Studies: No results found.  Scheduled Meds:  albuterol  2 puff Inhalation Q6H   amLODipine  5 mg Oral Daily   vitamin C  500 mg Oral Daily   baricitinib  4 mg Oral Daily   chlorhexidine  15 mL Mouth/Throat BID   cloNIDine  0.1 mg Oral Daily   enoxaparin (LOVENOX) injection  40 mg Subcutaneous BID   spironolactone  25 mg Oral Daily   And   hydrochlorothiazide  25 mg Oral Daily   insulin aspart  0-20 Units Subcutaneous TID WC   insulin aspart  0-5 Units Subcutaneous QHS   insulin detemir  14 Units Subcutaneous BID   magic mouthwash  5 mL Oral QID   And   lidocaine  15 mL Mouth/Throat QID   linagliptin  5 mg Oral Daily   methylPREDNISolone  (SOLU-MEDROL) injection  60 mg Intravenous BID   multivitamin with minerals  1 tablet Oral Daily   Ensure Max Protein  11 oz Oral TID   sodium chloride flush  10 mL Intravenous Q12H   zinc sulfate  220 mg Oral Daily   Continuous Infusions:  remdesivir 100 mg in NS 100 mL 100 mg (12/21/19 1012)   PRN Meds: acetaminophen, chlorpheniramine-HYDROcodone, guaiFENesin-dextromethorphan, hydrALAZINE, HYDROcodone-acetaminophen, labetalol, ondansetron **OR** ondansetron (ZOFRAN) IV  Time spent: 35 minutes  Author: Lynden Oxford, MD [facility] 12/21/2019 6:56 PM  To reach On-call, see care teams to locate the attending and reach out via [email or web address]. Between 7PM-7AM, please contact night-coverage If you still have difficulty reaching the attending provider, please page the [facility] (Director on Call) for [facility] on amion for assistance.

## 2019-12-22 LAB — COMPREHENSIVE METABOLIC PANEL
ALT: 104 U/L — ABNORMAL HIGH (ref 0–44)
AST: 146 U/L — ABNORMAL HIGH (ref 15–41)
Albumin: 3.4 g/dL — ABNORMAL LOW (ref 3.5–5.0)
Alkaline Phosphatase: 73 U/L (ref 38–126)
Anion gap: 12 (ref 5–15)
BUN: 23 mg/dL — ABNORMAL HIGH (ref 6–20)
CO2: 26 mmol/L (ref 22–32)
Calcium: 8.7 mg/dL — ABNORMAL LOW (ref 8.9–10.3)
Chloride: 98 mmol/L (ref 98–111)
Creatinine, Ser: 1.02 mg/dL (ref 0.61–1.24)
GFR calc Af Amer: >60 mL/min (ref >60)
GFR calc non Af Amer: >60 mL/min (ref >60)
Glucose, Bld: 327 mg/dL — ABNORMAL HIGH (ref 70–99)
Potassium: 4.2 mmol/L (ref 3.5–5.1)
Sodium: 136 mmol/L (ref 135–145)
Total Bilirubin: 1.1 mg/dL (ref 0.3–1.2)
Total Protein: 6.8 g/dL (ref 6.5–8.1)

## 2019-12-22 LAB — CBC WITH DIFFERENTIAL/PLATELET
Abs Immature Granulocytes: 0.05 10*3/uL (ref 0.00–0.07)
Basophils Absolute: 0 10*3/uL (ref 0.0–0.1)
Basophils Relative: 0 %
Eosinophils Absolute: 0 10*3/uL (ref 0.0–0.5)
Eosinophils Relative: 0 %
HCT: 43.4 % (ref 39.0–52.0)
Hemoglobin: 14.6 g/dL (ref 13.0–17.0)
Immature Granulocytes: 1 %
Lymphocytes Relative: 9 %
Lymphs Abs: 0.8 10*3/uL (ref 0.7–4.0)
MCH: 27.8 pg (ref 26.0–34.0)
MCHC: 33.6 g/dL (ref 30.0–36.0)
MCV: 82.5 fL (ref 80.0–100.0)
Monocytes Absolute: 0.3 10*3/uL (ref 0.1–1.0)
Monocytes Relative: 4 %
Neutro Abs: 6.9 10*3/uL (ref 1.7–7.7)
Neutrophils Relative %: 86 %
Platelets: 204 10*3/uL (ref 150–400)
RBC: 5.26 MIL/uL (ref 4.22–5.81)
RDW: 15.4 % (ref 11.5–15.5)
Smear Review: NORMAL
WBC: 8 10*3/uL (ref 4.0–10.5)
nRBC: 0 % (ref 0.0–0.2)

## 2019-12-22 LAB — GLUCOSE, CAPILLARY
Glucose-Capillary: 293 mg/dL — ABNORMAL HIGH (ref 70–99)
Glucose-Capillary: 364 mg/dL — ABNORMAL HIGH (ref 70–99)
Glucose-Capillary: 346 mg/dL — ABNORMAL HIGH (ref 70–99)
Glucose-Capillary: 234 mg/dL — ABNORMAL HIGH (ref 70–99)

## 2019-12-22 LAB — C-REACTIVE PROTEIN: CRP: 5.7 mg/dL — ABNORMAL HIGH (ref <1.0)

## 2019-12-22 LAB — MAGNESIUM: Magnesium: 2.7 mg/dL — ABNORMAL HIGH (ref 1.7–2.4)

## 2019-12-22 LAB — FIBRIN DERIVATIVES D-DIMER (ARMC ONLY): Fibrin derivatives D-dimer (ARMC): 2245.86 ng/mL (FEU) — ABNORMAL HIGH (ref 0.00–499.00)

## 2019-12-22 LAB — FERRITIN: Ferritin: 3306 ng/mL — ABNORMAL HIGH (ref 24–336)

## 2019-12-22 MED ORDER — INSULIN ASPART 100 UNIT/ML ~~LOC~~ SOLN
10.0000 [IU] | Freq: Three times a day (TID) | SUBCUTANEOUS | Status: DC
  Administered 2019-12-22 – 2020-01-03 (×34): 10 [IU] via SUBCUTANEOUS
  Filled 2019-12-22 (×32): qty 1

## 2019-12-22 MED ORDER — INSULIN DETEMIR 100 UNIT/ML ~~LOC~~ SOLN
20.0000 [IU] | Freq: Two times a day (BID) | SUBCUTANEOUS | Status: DC
  Administered 2019-12-22 – 2019-12-23 (×2): 20 [IU] via SUBCUTANEOUS
  Filled 2019-12-22 (×4): qty 0.2

## 2019-12-22 MED ORDER — INSULIN STARTER KIT- PEN NEEDLES (ENGLISH)
1.0000 | Freq: Once | Status: AC
  Administered 2019-12-22: 1
  Filled 2019-12-22: qty 1

## 2019-12-22 MED ORDER — LIVING WELL WITH DIABETES BOOK
Freq: Once | Status: AC
  Filled 2019-12-22: qty 1

## 2019-12-22 MED ORDER — VITAMIN D 25 MCG (1000 UNIT) PO TABS
1000.0000 [IU] | ORAL_TABLET | Freq: Every day | ORAL | Status: DC
  Administered 2019-12-22 – 2020-01-03 (×13): 1000 [IU] via ORAL
  Filled 2019-12-22 (×13): qty 1

## 2019-12-22 NOTE — Progress Notes (Signed)
Inpatient Diabetes Program Recommendations  AACE/ADA: New Consensus Statement on Inpatient Glycemic Control (2015)  Target Ranges:  Prepandial:   less than 140 mg/dL      Peak postprandial:   less than 180 mg/dL (1-2 hours)      Critically ill patients:  140 - 180 mg/dL   Lab Results  Component Value Date   GLUCAP 293 (H) 12/22/2019   HGBA1C 10.4 (H) 12/20/2019    Review of Glycemic Control Results for [name] (MRN [id number]) as of 12/22/2019 09:10  Ref. Range 12/21/2019 11:43 12/21/2019 16:10 12/21/2019 21:04 12/22/2019 07:56  Glucose-Capillary Latest Ref Range: 70 - 99 mg/dL 410 (H) 397 (H) 325 (H) 293 (H)   Diabetes history: DM2 Outpatient Diabetes medications: none Current orders for Inpatient glycemic control: Levemir 14 units bid + Novolog resistant tid + hs 0-5 units + Tradjenta 5 mg  Inpatient Diabetes Program Recommendations:   Noted patient plan to discharge home on insulin. Ordered insulin pen starter kit which has pen needles for patient for teaching. Nurses, please start allowing patient to give own injections after teaching proper insulin injection technique. Spoke with RN Georgina Peer duffy and plans to review insulin administration with patient using insulin pen teaching startion. Ordered Living Well With Diabetes book for patient as well. While on steroids: -Increase Levemir to 20 units bid -Add Novolog 10 units tid meal coverage if eats 50% Secure text sent to Dr. Posey Pronto.  Thank you, Nani Gasser. Kohler Pellerito, RN, MSN, CDE  Diabetes Coordinator Inpatient Glycemic Control Team Team Pager [phone] (8am-5pm) 12/22/2019 9:24 AM

## 2019-12-22 NOTE — Progress Notes (Signed)
[facility] Progress Note  Patient: [name]    ZOX:[id number]  DOA: 12/19/2019     Date of Service: the patient was seen and examined on 12/22/2019  Brief hospital course: Past medical history of morbid obesity. Patient does not take any medications for diabetes or hypertension. Presents with shortness of breath and found to have COVID-19 pneumonia as well as hypertensive urgency. Currently plan is continue current care.  Assessment and Plan: 1. Acute hypoxic respiratory failure Acute COVID-19 Viral Pneumonia CXR: hazy bilateral peripheral opacities Tmax last 24 hours:  Temp (24hrs), Avg:97.9 F (36.6 C), Min:97.4 F (36.3 C), Max:98.3 F (36.8 C)  Oxygen requirements:   Currently on 15 L of oxygen saturating 90%.  Occasionally requiring NRB on exertion. CRP: 13.5 to 5.7 Remdesivir: Started on 12/19/2019 Steroids: Started on 12/19/2019 with Decadron. Currently on Solu-Medrol 60 mg twice daily.  Baricitinib/Actemra(off-label use): Also started on 12/19/2019. The investigational nature of this medication was discussed with the patient/HCPOA and they choose to proceed as the potential benefits are felt to outweigh risks at this time.  Antibiotics:none Vitamin C and Zinc: continue DVT Prophylaxis: enoxaparin (LOVENOX) injection 40 mg Start: 12/20/19 0215 Prone positioning: Patient encouraged to stay in prone position as much as possible.  The treatment plan and use of medications and known side effects were discussed with patient/family. It was clearly explained that Complete risks and long-term side effects are unknown, however in the best clinical judgment they seem to be of some clinical benefit rather than medical risks. Patient/family agree with the treatment plan and want to receive these treatments as indicated.   2. Hypertensive urgency Blood pressure significantly elevated, Beyond 200 systolic. Continue current regimen of clonidine, Norvasc, HCTZ and Aldactone as  well as as needed hydralazine. Monitor electrolytes.  3. Hypokalemia Corrected. Monitor.  4. Type 2 diabetes mellitus, uncontrolled with hyperglycemia Sugar significantly elevated. A1c 10.5. Continue current regimen for now. Patient has been informed that he will be discharged with insulin given his high A1c.  5. Elevated LFT In the setting of COVID-19. Monitor.  6. Leukopenia In the setting of COVID-19. Monitor.  7. Obesity, obesity hypoventilation syndrome versus OSA Putting the patient at high risk for worsening respiratory illness from COVID-19. Monitor. Body mass index is 42.14 kg/m.   8. Oral ulcer Patient has seen urgent care for the same. Continuing chlorhexidine and Magic mouthwash with lidocaine. Outpatient ENT follow-up recommended.  Diet: Cardiac and carb modified diet DVT Prophylaxis:   enoxaparin (LOVENOX) injection 40 mg Start: 12/20/19 0215    Advance goals of care discussion: Full code  Family Communication: no family was present at bedside, at the time of interview.   Disposition:  Status is: Inpatient  Remains inpatient appropriate because:IV treatments appropriate due to intensity of illness or inability to take PO   Dispo: The patient is from: Home              Anticipated d/c is to: Home              Anticipated d/c date is: > 3 days              Patient currently is not medically stable to d/c.   Subjective: Continues to have shortness of breath continues to have cough.  No nausea no vomiting.  Still has sore throat.  Oral intake still minimal.  Physical Exam:  General: Appear in mild distress, no Rash; Oral Mucosa clear, has chronic ulcer present since last 1 week. no  Abnormal Neck Mass Or lumps, Conjunctiva normal  Cardiovascular: S1 and S2 Present, no Murmur, Respiratory: increased respiratory effort, Bilateral Air entry present and bilateral  Crackles, no wheezes Abdomen: Bowel Sound present, Soft and no tenderness Extremities: no  Pedal edema, nop calf tenderness Neurology: alert and oriented to time, place, and person affect appropriate. no new focal deficit Gait not checked due to patient safety concerns  Vitals:   12/22/19 0851 12/22/19 1136 12/22/19 1435 12/22/19 1955  BP:  138/74 (!) 150/79 (!) 146/94  Pulse:  78 85 78  Resp:  18 18 18   Temp:  98.1 F (36.7 C) 98.3 F (36.8 C) 97.8 F (36.6 C)  TempSrc:  Oral Oral Oral  SpO2: 97% 100% 100% 99%  Weight:      Height:        Intake/Output Summary (Last 24 hours) at 12/22/2019 2009 Last data filed at 12/22/2019 0900 Gross per 24 hour  Intake 360 ml  Output 0 ml  Net 360 ml   Filed Weights   12/19/19 1949 12/21/19 0440 12/22/19 0433  Weight: (!) 142.8 kg (!) 140.9 kg (!) 140.9 kg    Data Reviewed: I have personally reviewed and interpreted daily labs, tele strips, imagings as discussed above. I reviewed all nursing notes, pharmacy notes, vitals, pertinent old records I have discussed plan of care as described above with RN and patient/family.  CBC: Recent Labs  Lab 12/20/19 0109 12/21/19 0456 12/22/19 0602  WBC 3.6* 5.7 8.0  NEUTROABS 2.9 4.9 6.9  HGB 13.5 14.7 14.6  HCT 39.8 44.1 43.4  MCV 82.7 82.6 82.5  PLT 127* 159 204   Basic Metabolic Panel: Recent Labs  Lab 12/20/19 0109 12/21/19 0456 12/22/19 0602  NA 135 136 136  K 3.3* 4.0 4.2  CL 100 102 98  CO2 24 24 26   GLUCOSE 203* 297* 327*  BUN 9 15 23*  CREATININE 1.06 0.91 1.02  CALCIUM 8.0* 8.0* 8.7*  MG 2.0 2.0 2.7*    Studies: No results found.  Scheduled Meds: . albuterol  2 puff Inhalation Q6H  . amLODipine  5 mg Oral Daily  . vitamin C  500 mg Oral Daily  . baricitinib  4 mg Oral Daily  . chlorhexidine  15 mL Mouth/Throat BID  . cholecalciferol  1,000 Units Oral Daily  . cloNIDine  0.1 mg Oral Daily  . enoxaparin (LOVENOX) injection  40 mg Subcutaneous BID  . spironolactone  25 mg Oral Daily   And  . hydrochlorothiazide  25 mg Oral Daily  . insulin aspart   0-20 Units Subcutaneous TID WC  . insulin aspart  0-5 Units Subcutaneous QHS  . insulin aspart  10 Units Subcutaneous TID WC  . insulin detemir  20 Units Subcutaneous BID  . magic mouthwash  5 mL Oral QID   And  . lidocaine  15 mL Mouth/Throat QID  . linagliptin  5 mg Oral Daily  . melatonin  5 mg Oral QHS  . methylPREDNISolone (SOLU-MEDROL) injection  60 mg Intravenous BID  . multivitamin with minerals  1 tablet Oral Daily  . Ensure Max Protein  11 oz Oral TID  . sodium chloride flush  10 mL Intravenous Q12H  . zinc sulfate  220 mg Oral Daily   Continuous Infusions: . remdesivir 100 mg in NS 100 mL 100 mg (12/22/19 0850)   PRN Meds: acetaminophen, chlorpheniramine-HYDROcodone, guaiFENesin-dextromethorphan, hydrALAZINE, HYDROcodone-acetaminophen, labetalol, ondansetron **OR** ondansetron (ZOFRAN) IV  Time spent: 35 minutes  Author: , MD  [facility] 12/22/2019 8:09 PM  To reach On-call, see care teams to locate the attending and reach out via [email or web address]. Between 7PM-7AM, please contact night-coverage If you still have difficulty reaching the attending provider, please page the [facility] (Director on Call) for [facility] on amion for assistance.

## 2019-12-23 LAB — CBC WITH DIFFERENTIAL/PLATELET
Abs Immature Granulocytes: 0.25 10*3/uL — ABNORMAL HIGH (ref 0.00–0.07)
Basophils Absolute: 0 10*3/uL (ref 0.0–0.1)
Basophils Relative: 0 %
Eosinophils Absolute: 0 10*3/uL (ref 0.0–0.5)
Eosinophils Relative: 0 %
HCT: 43.3 % (ref 39.0–52.0)
Hemoglobin: 14.7 g/dL (ref 13.0–17.0)
Immature Granulocytes: 2 %
Lymphocytes Relative: 9 %
Lymphs Abs: 1.1 10*3/uL (ref 0.7–4.0)
MCH: 27.9 pg (ref 26.0–34.0)
MCHC: 33.9 g/dL (ref 30.0–36.0)
MCV: 82.3 fL (ref 80.0–100.0)
Monocytes Absolute: 0.6 10*3/uL (ref 0.1–1.0)
Monocytes Relative: 5 %
Neutro Abs: 10 10*3/uL — ABNORMAL HIGH (ref 1.7–7.7)
Neutrophils Relative %: 84 %
Platelets: 228 10*3/uL (ref 150–400)
RBC: 5.26 MIL/uL (ref 4.22–5.81)
RDW: 15.2 % (ref 11.5–15.5)
WBC Morphology: INCREASED
WBC: 11.9 10*3/uL — ABNORMAL HIGH (ref 4.0–10.5)
nRBC: 0 % (ref 0.0–0.2)

## 2019-12-23 LAB — GLUCOSE, CAPILLARY
Glucose-Capillary: 280 mg/dL — ABNORMAL HIGH (ref 70–99)
Glucose-Capillary: 248 mg/dL — ABNORMAL HIGH (ref 70–99)
Glucose-Capillary: 265 mg/dL — ABNORMAL HIGH (ref 70–99)
Glucose-Capillary: 191 mg/dL — ABNORMAL HIGH (ref 70–99)

## 2019-12-23 LAB — COMPREHENSIVE METABOLIC PANEL
ALT: 102 U/L — ABNORMAL HIGH (ref 0–44)
AST: 103 U/L — ABNORMAL HIGH (ref 15–41)
Albumin: 3.2 g/dL — ABNORMAL LOW (ref 3.5–5.0)
Alkaline Phosphatase: 86 U/L (ref 38–126)
Anion gap: 9 (ref 5–15)
BUN: 24 mg/dL — ABNORMAL HIGH (ref 6–20)
CO2: 27 mmol/L (ref 22–32)
Calcium: 8.4 mg/dL — ABNORMAL LOW (ref 8.9–10.3)
Chloride: 99 mmol/L (ref 98–111)
Creatinine, Ser: 0.96 mg/dL (ref 0.61–1.24)
GFR calc Af Amer: >60 mL/min (ref >60)
GFR calc non Af Amer: >60 mL/min (ref >60)
Glucose, Bld: 257 mg/dL — ABNORMAL HIGH (ref 70–99)
Potassium: 4 mmol/L (ref 3.5–5.1)
Sodium: 135 mmol/L (ref 135–145)
Total Bilirubin: 0.9 mg/dL (ref 0.3–1.2)
Total Protein: 6.6 g/dL (ref 6.5–8.1)

## 2019-12-23 LAB — MAGNESIUM: Magnesium: 2.3 mg/dL (ref 1.7–2.4)

## 2019-12-23 LAB — FIBRIN DERIVATIVES D-DIMER (ARMC ONLY): Fibrin derivatives D-dimer (ARMC): 3016.65 ng/mL (FEU) — ABNORMAL HIGH (ref 0.00–499.00)

## 2019-12-23 LAB — FERRITIN: Ferritin: 1849 ng/mL — ABNORMAL HIGH (ref 24–336)

## 2019-12-23 LAB — C-REACTIVE PROTEIN: CRP: 1.9 mg/dL — ABNORMAL HIGH (ref <1.0)

## 2019-12-23 MED ORDER — INSULIN DETEMIR 100 UNIT/ML ~~LOC~~ SOLN
24.0000 [IU] | Freq: Two times a day (BID) | SUBCUTANEOUS | Status: DC
  Administered 2019-12-23 – 2019-12-26 (×5): 24 [IU] via SUBCUTANEOUS
  Filled 2019-12-23 (×7): qty 0.24

## 2019-12-23 MED ORDER — MENTHOL 3 MG MT LOZG
1.0000 | LOZENGE | OROMUCOSAL | Status: DC | PRN
  Filled 2019-12-23: qty 9

## 2019-12-23 MED ORDER — FUROSEMIDE 10 MG/ML IJ SOLN
40.0000 mg | Freq: Once | INTRAMUSCULAR | Status: DC
  Administered 2019-12-23: 40 mg via INTRAVENOUS

## 2019-12-23 MED ORDER — PHENOL 1.4 % MT LIQD
1.0000 | OROMUCOSAL | Status: DC | PRN
  Administered 2019-12-23: 1 via OROMUCOSAL
  Filled 2019-12-23: qty 177

## 2019-12-23 MED ORDER — FUROSEMIDE 10 MG/ML IJ SOLN
INTRAMUSCULAR | Status: AC
  Filled 2019-12-23: qty 4

## 2019-12-23 NOTE — Progress Notes (Signed)
[facility] Progress Note  Patient: [name]    UJW:[id number]  DOA: 12/19/2019     Date of Service: the patient was seen and examined on 12/23/2019  Brief hospital course: Past medical history of morbid obesity. Patient does not take any medications for diabetes or hypertension. Presents with shortness of breath and found to have COVID-19 pneumonia as well as hypertensive urgency. Currently plan is continue current care.  Assessment and Plan: 1. Acute hypoxic respiratory failure Acute COVID-19 Viral Pneumonia CXR: hazy bilateral peripheral opacities Oxygen requirements: FiO2 (%):  [100 %] 100 % Currently on 15 L of oxygen saturating 90%.  Occasionally requiring NRB CRP: 13.5 to 1.9 Remdesivir: Started on 12/19/2019 Steroids: Started on 12/19/2019 with Decadron. Currently on Solu-Medrol 60 mg twice daily.  Baricitinib/Actemra(off-label use): Also started on 12/19/2019. The investigational nature of this medication was discussed with the patient/HCPOA and they choose to proceed as the potential benefits are felt to outweigh risks at this time.  Antibiotics:none Vitamin C and Zinc: continue DVT Prophylaxis: enoxaparin (LOVENOX) injection 40 mg Start: 12/20/19 0215 Prone positioning: Patient encouraged to stay in prone position as much as possible.  The treatment plan and use of medications and known side effects were discussed with patient/family. It was clearly explained that Complete risks and long-term side effects are unknown, however in the best clinical judgment they seem to be of some clinical benefit rather than medical risks. Patient/family agree with the treatment plan and want to receive these treatments as indicated.   2. Hypertensive urgency Blood pressure significantly elevated on admission, Beyond 200 systolic.  Now better. Continue current regimen of clonidine, Norvasc, HCTZ and Aldactone as well as as needed hydralazine. Monitor electrolytes.  3.  Hypokalemia Corrected. Monitor.  4. Type 2 diabetes mellitus, uncontrolled with hyperglycemia Sugar significantly elevated. A1c 10.5. Continue current regimen for now. Patient has been informed that he will be discharged with insulin given his high A1c.  5. Elevated LFT In the setting of COVID-19. Monitor.  6. Leukopenia In the setting of COVID-19. Monitor.  7. Obesity, Putting the patient at high risk for worsening respiratory illness from COVID-19. Patient reports having a negative sleep study 2 years ago at home. Monitor. Body mass index is 41.75 kg/m.   8. Oral ulcer Patient has seen urgent care for the same. Continuing chlorhexidine and Magic mouthwash with lidocaine. Outpatient ENT follow-up recommended.  Diet: Cardiac and carb modified diet DVT Prophylaxis:   enoxaparin (LOVENOX) injection 40 mg Start: 12/20/19 0215    Advance goals of care discussion: Full code  Family Communication: no family was present at bedside, at the time of interview.   Disposition:  Status is: Inpatient  Remains inpatient appropriate because:IV treatments appropriate due to intensity of illness or inability to take PO   Dispo: The patient is from: Home              Anticipated d/c is to: Home              Anticipated d/c date is: > 3 days              Patient currently is not medically stable to d/c.  Subjective: No acute complaint.  Continues to have fatigue and tiredness.  Continues to have cough and shortness of breath.  No chest pain.  Physical Exam: General: Appear in mild distress, no Rash; Oral Mucosa Clear, moist.  Ulcer about the same,  Abnormal Neck Mass Or lumps, Conjunctiva normal  Cardiovascular: S1 and  S2 Present, no Murmur, Respiratory: increased respiratory effort, Bilateral Air entry present and bilateral  Crackles, no wheezes Abdomen: Bowel Sound present, Soft and no tenderness Extremities: no Pedal edema, no calf tenderness Neurology: alert and oriented to  time, place, and person affect appropriate. no new focal deficit Gait not checked due to patient safety concerns  Vitals:   12/23/19 0541 12/23/19 0705 12/23/19 0818 12/23/19 1117  BP: (!) 133/107  (!) 159/95 (!) 142/80  Pulse: 72  76 76  Resp: 19  18 20   Temp: 97.6 F (36.4 C)  98.1 F (36.7 C) 98.1 F (36.7 C)  TempSrc: Oral  Oral Oral  SpO2: 93% 92% 100%   Weight:      Height:       No intake or output data in the 24 hours ending 12/23/19 1624 Filed Weights   12/21/19 0440 12/22/19 0433 12/23/19 0540  Weight: (!) 140.9 kg (!) 140.9 kg (!) 139.6 kg    Data Reviewed: I have personally reviewed and interpreted daily labs, tele strips, imagings as discussed above. I reviewed all nursing notes, pharmacy notes, vitals, pertinent old records I have discussed plan of care as described above with RN and patient/family.  CBC: Recent Labs  Lab 12/20/19 0109 12/21/19 0456 12/22/19 0602 12/23/19 0530  WBC 3.6* 5.7 8.0 11.9*  NEUTROABS 2.9 4.9 6.9 10.0*  HGB 13.5 14.7 14.6 14.7  HCT 39.8 44.1 43.4 43.3  MCV 82.7 82.6 82.5 82.3  PLT 127* 159 204 228   Basic Metabolic Panel: Recent Labs  Lab 12/20/19 0109 12/21/19 0456 12/22/19 0602 12/23/19 0530  NA 135 136 136 135  K 3.3* 4.0 4.2 4.0  CL 100 102 98 99  CO2 24 24 26 27   GLUCOSE 203* 297* 327* 257*  BUN 9 15 23* 24*  CREATININE 1.06 0.91 1.02 0.96  CALCIUM 8.0* 8.0* 8.7* 8.4*  MG 2.0 2.0 2.7* 2.3    Studies: No results found.  Scheduled Meds: . furosemide      . albuterol  2 puff Inhalation Q6H  . amLODipine  5 mg Oral Daily  . vitamin C  500 mg Oral Daily  . baricitinib  4 mg Oral Daily  . chlorhexidine  15 mL Mouth/Throat BID  . cholecalciferol  1,000 Units Oral Daily  . cloNIDine  0.1 mg Oral Daily  . enoxaparin (LOVENOX) injection  40 mg Subcutaneous BID  . spironolactone  25 mg Oral Daily   And  . hydrochlorothiazide  25 mg Oral Daily  . insulin aspart  0-20 Units Subcutaneous TID WC  . insulin  aspart  0-5 Units Subcutaneous QHS  . insulin aspart  10 Units Subcutaneous TID WC  . insulin detemir  20 Units Subcutaneous BID  . magic mouthwash  5 mL Oral QID   And  . lidocaine  15 mL Mouth/Throat QID  . linagliptin  5 mg Oral Daily  . melatonin  5 mg Oral QHS  . methylPREDNISolone (SOLU-MEDROL) injection  60 mg Intravenous BID  . multivitamin with minerals  1 tablet Oral Daily  . Ensure Max Protein  11 oz Oral TID  . sodium chloride flush  10 mL Intravenous Q12H  . zinc sulfate  220 mg Oral Daily   Continuous Infusions: . remdesivir 100 mg in NS 100 mL 100 mg (12/23/19 1058)   PRN Meds: acetaminophen, chlorpheniramine-HYDROcodone, guaiFENesin-dextromethorphan, hydrALAZINE, HYDROcodone-acetaminophen, labetalol, menthol-cetylpyridinium, ondansetron **OR** ondansetron (ZOFRAN) IV, phenol  Time spent: 35 minutes  Author: , MD [facility] 12/23/2019  4:24 PM  To reach On-call, see care teams to locate the attending and reach out via [email or web address]. Between 7PM-7AM, please contact night-coverage If you still have difficulty reaching the attending provider, please page the [facility] (Director on Call) for [facility] on amion for assistance.

## 2019-12-24 LAB — CBC WITH DIFFERENTIAL/PLATELET
Abs Immature Granulocytes: 0.21 10*3/uL — ABNORMAL HIGH (ref 0.00–0.07)
Basophils Absolute: 0.1 10*3/uL (ref 0.0–0.1)
Basophils Relative: 1 %
Eosinophils Absolute: 0 10*3/uL (ref 0.0–0.5)
Eosinophils Relative: 0 %
HCT: 46.2 % (ref 39.0–52.0)
Hemoglobin: 15.4 g/dL (ref 13.0–17.0)
Immature Granulocytes: 2 %
Lymphocytes Relative: 12 %
Lymphs Abs: 1.6 10*3/uL (ref 0.7–4.0)
MCH: 27.3 pg (ref 26.0–34.0)
MCHC: 33.3 g/dL (ref 30.0–36.0)
MCV: 81.9 fL (ref 80.0–100.0)
Monocytes Absolute: 0.6 10*3/uL (ref 0.1–1.0)
Monocytes Relative: 5 %
Neutro Abs: 10.6 10*3/uL — ABNORMAL HIGH (ref 1.7–7.7)
Neutrophils Relative %: 80 %
Platelets: 270 10*3/uL (ref 150–400)
RBC: 5.64 MIL/uL (ref 4.22–5.81)
RDW: 15.2 % (ref 11.5–15.5)
Smear Review: NORMAL
WBC: 13.1 10*3/uL — ABNORMAL HIGH (ref 4.0–10.5)
nRBC: 0 % (ref 0.0–0.2)

## 2019-12-24 LAB — GLUCOSE, CAPILLARY
Glucose-Capillary: 263 mg/dL — ABNORMAL HIGH (ref 70–99)
Glucose-Capillary: 263 mg/dL — ABNORMAL HIGH (ref 70–99)
Glucose-Capillary: 208 mg/dL — ABNORMAL HIGH (ref 70–99)
Glucose-Capillary: 106 mg/dL — ABNORMAL HIGH (ref 70–99)

## 2019-12-24 LAB — COMPREHENSIVE METABOLIC PANEL
ALT: 102 U/L — ABNORMAL HIGH (ref 0–44)
AST: 71 U/L — ABNORMAL HIGH (ref 15–41)
Albumin: 3.3 g/dL — ABNORMAL LOW (ref 3.5–5.0)
Alkaline Phosphatase: 91 U/L (ref 38–126)
Anion gap: 12 (ref 5–15)
BUN: 26 mg/dL — ABNORMAL HIGH (ref 6–20)
CO2: 26 mmol/L (ref 22–32)
Calcium: 8.4 mg/dL — ABNORMAL LOW (ref 8.9–10.3)
Chloride: 96 mmol/L — ABNORMAL LOW (ref 98–111)
Creatinine, Ser: 0.85 mg/dL (ref 0.61–1.24)
GFR calc Af Amer: >60 mL/min (ref >60)
GFR calc non Af Amer: >60 mL/min (ref >60)
Glucose, Bld: 240 mg/dL — ABNORMAL HIGH (ref 70–99)
Potassium: 3.8 mmol/L (ref 3.5–5.1)
Sodium: 134 mmol/L — ABNORMAL LOW (ref 135–145)
Total Bilirubin: 1 mg/dL (ref 0.3–1.2)
Total Protein: 6.7 g/dL (ref 6.5–8.1)

## 2019-12-24 LAB — FERRITIN: Ferritin: 1356 ng/mL — ABNORMAL HIGH (ref 24–336)

## 2019-12-24 LAB — FIBRIN DERIVATIVES D-DIMER (ARMC ONLY): Fibrin derivatives D-dimer (ARMC): 3605.46 ng/mL (FEU) — ABNORMAL HIGH (ref 0.00–499.00)

## 2019-12-24 LAB — C-REACTIVE PROTEIN: CRP: 1.3 mg/dL — ABNORMAL HIGH (ref <1.0)

## 2019-12-24 LAB — MAGNESIUM: Magnesium: 2.5 mg/dL — ABNORMAL HIGH (ref 1.7–2.4)

## 2019-12-24 NOTE — Progress Notes (Signed)
[facility] Progress Note  Patient: [name]    MVH:[id number]  DOA: 12/19/2019     Date of Service: the patient was seen and examined on 12/24/2019  Brief hospital course: Past medical history of morbid obesity. Patient does not take any medications for diabetes or hypertension. Presents with shortness of breath and found to have COVID-19 pneumonia as well as hypertensive urgency. Currently plan is continue current care.  Assessment and Plan: 1. Acute hypoxic respiratory failure Acute COVID-19 Viral Pneumonia CXR: hazy bilateral peripheral opacities Oxygen requirements:   Currently on 15 L of oxygen saturating 90%.  Occasionally requiring NRB CRP: 13.5 to 1.9 Remdesivir: Started on 12/19/2019 Steroids: Started on 12/19/2019 with Decadron. Currently on Solu-Medrol 60 mg twice daily.  Baricitinib/Actemra(off-label use): Also started on 12/19/2019. The investigational nature of this medication was discussed with the patient/HCPOA and they choose to proceed as the potential benefits are felt to outweigh risks at this time.  Antibiotics:none Vitamin C and Zinc: continue DVT Prophylaxis: enoxaparin (LOVENOX) injection 40 mg Start: 12/20/19 0215 Prone positioning: Patient encouraged to stay in prone position as much as possible. Currently awaiting improvement in oxygenation.  The treatment plan and use of medications and known side effects were discussed with patient/family. It was clearly explained that Complete risks and long-term side effects are unknown, however in the best clinical judgment they seem to be of some clinical benefit rather than medical risks. Patient/family agree with the treatment plan and want to receive these treatments as indicated.   2. Hypertensive urgency Blood pressure significantly elevated on admission, Beyond 200 systolic.  Now better. Continue current regimen of clonidine, Norvasc, HCTZ and Aldactone as well as as needed hydralazine. Monitor  electrolytes.  3. Hypokalemia Corrected. Monitor.  4. Type 2 diabetes mellitus, uncontrolled with hyperglycemia Sugar significantly elevated. A1c 10.5. Continue current regimen for now. Patient has been informed that he will be discharged with insulin given his high A1c.  5. Elevated LFT In the setting of COVID-19. Monitor.  6. Leukopenia In the setting of COVID-19. Monitor.  7. Obesity, Putting the patient at high risk for worsening respiratory illness from COVID-19. Patient reports having a negative sleep study 2 years ago at home. Monitor. Body mass index is 41.75 kg/m.   8. Oral ulcer Patient has seen urgent care for the same. Continuing chlorhexidine and Magic mouthwash with lidocaine. Outpatient ENT follow-up recommended.  9.  Elevated D-dimer. D-dimer is trending upwards despite improvement in other inflammatory markers. At present patient does not have any new hypotension or worsening hypoxia or tachycardia. Has some chest pain which is new. We will continue to monitor for now. May require CT scan if has any hypotension or tachycardia or hypoxia.  Diet: Cardiac and carb modified diet DVT Prophylaxis:   enoxaparin (LOVENOX) injection 40 mg Start: 12/20/19 0215    Advance goals of care discussion: Full code  Family Communication: no family was present at bedside, at the time of interview.   Disposition:  Status is: Inpatient  Remains inpatient appropriate because:IV treatments appropriate due to intensity of illness or inability to take PO   Dispo: The patient is from: Home              Anticipated d/c is to: Home              Anticipated d/c date is: > 3 days              Patient currently is not medically stable to d/c.  Subjective: Remains fatigue and tired but no nausea no vomiting continues to have shortness of breath but reports some chest pain when she he is taking a deep breath bilaterally.  He reports this pain is different than his chest pain  that he has been having since admission.  Physical Exam: General: Appear in mild distress, no Rash; Oral Mucosa Clear, moist.  Ulcer about the same,  Abnormal Neck Mass Or lumps, Conjunctiva normal  Cardiovascular: S1 and S2 Present, no Murmur, Respiratory: increased respiratory effort, Bilateral Air entry present and bilateral  Crackles, no wheezes Abdomen: Bowel Sound present, Soft and no tenderness Extremities: no Pedal edema, no calf tenderness Neurology: alert and oriented to time, place, and person affect appropriate. no new focal deficit Gait not checked due to patient safety concerns  Vitals:   12/24/19 0854 12/24/19 0913 12/24/19 1249 12/24/19 1601  BP:  (!) 144/77 (!) 162/93 (!) 149/87  Pulse:  71 74 79  Resp:  20 19 19   Temp:  98 F (36.7 C) 97.7 F (36.5 C) 98.5 F (36.9 C)  TempSrc:  Oral Oral   SpO2: 92% 92% 100% 100%  Weight:      Height:        Intake/Output Summary (Last 24 hours) at 12/24/2019 1851 Last data filed at 12/24/2019 1301 Gross per 24 hour  Intake 360 ml  Output 1800 ml  Net -1440 ml   Filed Weights   12/21/19 0440 12/22/19 0433 12/23/19 0540  Weight: (!) 140.9 kg (!) 140.9 kg (!) 139.6 kg    Data Reviewed: I have personally reviewed and interpreted daily labs, tele strips, imagings as discussed above. I reviewed all nursing notes, pharmacy notes, vitals, pertinent old records I have discussed plan of care as described above with RN and patient/family.  CBC: Recent Labs  Lab 12/20/19 0109 12/21/19 0456 12/22/19 0602 12/23/19 0530 12/24/19 0628  WBC 3.6* 5.7 8.0 11.9* 13.1*  NEUTROABS 2.9 4.9 6.9 10.0* 10.6*  HGB 13.5 14.7 14.6 14.7 15.4  HCT 39.8 44.1 43.4 43.3 46.2  MCV 82.7 82.6 82.5 82.3 81.9  PLT 127* 159 204 228 270   Basic Metabolic Panel: Recent Labs  Lab 12/20/19 0109 12/21/19 0456 12/22/19 0602 12/23/19 0530 12/24/19 0628  NA 135 136 136 135 134*  K 3.3* 4.0 4.2 4.0 3.8  CL 100 102 98 99 96*  CO2 24 24 26 27 26     GLUCOSE 203* 297* 327* 257* 240*  BUN 9 15 23* 24* 26*  CREATININE 1.06 0.91 1.02 0.96 0.85  CALCIUM 8.0* 8.0* 8.7* 8.4* 8.4*  MG 2.0 2.0 2.7* 2.3 2.5*    Studies: No results found.  Scheduled Meds: . albuterol  2 puff Inhalation Q6H  . amLODipine  5 mg Oral Daily  . vitamin C  500 mg Oral Daily  . baricitinib  4 mg Oral Daily  . chlorhexidine  15 mL Mouth/Throat BID  . cholecalciferol  1,000 Units Oral Daily  . cloNIDine  0.1 mg Oral Daily  . enoxaparin (LOVENOX) injection  40 mg Subcutaneous BID  . spironolactone  25 mg Oral Daily   And  . hydrochlorothiazide  25 mg Oral Daily  . insulin aspart  0-20 Units Subcutaneous TID WC  . insulin aspart  0-5 Units Subcutaneous QHS  . insulin aspart  10 Units Subcutaneous TID WC  . insulin detemir  24 Units Subcutaneous BID  . linagliptin  5 mg Oral Daily  . magic mouthwash  5 mL Oral QID  .  melatonin  5 mg Oral QHS  . methylPREDNISolone (SOLU-MEDROL) injection  60 mg Intravenous BID  . multivitamin with minerals  1 tablet Oral Daily  . Ensure Max Protein  11 oz Oral TID  . sodium chloride flush  10 mL Intravenous Q12H  . zinc sulfate  220 mg Oral Daily   Continuous Infusions:  PRN Meds: acetaminophen, chlorpheniramine-HYDROcodone, guaiFENesin-dextromethorphan, hydrALAZINE, HYDROcodone-acetaminophen, labetalol, menthol-cetylpyridinium, ondansetron **OR** ondansetron (ZOFRAN) IV, phenol  Time spent: 35 minutes  Author: Lynden Oxford, MD [facility] 12/24/2019 6:51 PM  To reach On-call, see care teams to locate the attending and reach out via [email or web address]. Between 7PM-7AM, please contact night-coverage If you still have difficulty reaching the attending provider, please page the [facility] (Director on Call) for [facility] on amion for assistance.

## 2019-12-24 NOTE — Plan of Care (Signed)

## 2019-12-25 ENCOUNTER — Inpatient Hospital Stay: Payer: Commercial Managed Care - PPO

## 2019-12-25 LAB — COMPREHENSIVE METABOLIC PANEL
ALT: 109 U/L — ABNORMAL HIGH (ref 0–44)
AST: 73 U/L — ABNORMAL HIGH (ref 15–41)
Albumin: 3.4 g/dL — ABNORMAL LOW (ref 3.5–5.0)
Alkaline Phosphatase: 90 U/L (ref 38–126)
Anion gap: 11 (ref 5–15)
BUN: 20 mg/dL (ref 6–20)
CO2: 28 mmol/L (ref 22–32)
Calcium: 8.6 mg/dL — ABNORMAL LOW (ref 8.9–10.3)
Chloride: 96 mmol/L — ABNORMAL LOW (ref 98–111)
Creatinine, Ser: 0.99 mg/dL (ref 0.61–1.24)
GFR calc Af Amer: >60 mL/min (ref >60)
GFR calc non Af Amer: >60 mL/min (ref >60)
Glucose, Bld: 185 mg/dL — ABNORMAL HIGH (ref 70–99)
Potassium: 4 mmol/L (ref 3.5–5.1)
Sodium: 135 mmol/L (ref 135–145)
Total Bilirubin: 1.2 mg/dL (ref 0.3–1.2)
Total Protein: 7.3 g/dL (ref 6.5–8.1)

## 2019-12-25 LAB — MAGNESIUM: Magnesium: 2.2 mg/dL (ref 1.7–2.4)

## 2019-12-25 LAB — GLUCOSE, CAPILLARY
Glucose-Capillary: 219 mg/dL — ABNORMAL HIGH (ref 70–99)
Glucose-Capillary: 250 mg/dL — ABNORMAL HIGH (ref 70–99)
Glucose-Capillary: 202 mg/dL — ABNORMAL HIGH (ref 70–99)
Glucose-Capillary: 126 mg/dL — ABNORMAL HIGH (ref 70–99)

## 2019-12-25 LAB — CBC WITH DIFFERENTIAL/PLATELET
Abs Immature Granulocytes: 0.32 10*3/uL — ABNORMAL HIGH (ref 0.00–0.07)
Basophils Absolute: 0.1 10*3/uL (ref 0.0–0.1)
Basophils Relative: 0 %
Eosinophils Absolute: 0 10*3/uL (ref 0.0–0.5)
Eosinophils Relative: 0 %
HCT: 46.6 % (ref 39.0–52.0)
Hemoglobin: 15.8 g/dL (ref 13.0–17.0)
Immature Granulocytes: 2 %
Lymphocytes Relative: 8 %
Lymphs Abs: 1.1 10*3/uL (ref 0.7–4.0)
MCH: 27.8 pg (ref 26.0–34.0)
MCHC: 33.9 g/dL (ref 30.0–36.0)
MCV: 82 fL (ref 80.0–100.0)
Monocytes Absolute: 0.5 10*3/uL (ref 0.1–1.0)
Monocytes Relative: 4 %
Neutro Abs: 11.5 10*3/uL — ABNORMAL HIGH (ref 1.7–7.7)
Neutrophils Relative %: 86 %
Platelets: 305 10*3/uL (ref 150–400)
RBC: 5.68 MIL/uL (ref 4.22–5.81)
RDW: 15.2 % (ref 11.5–15.5)
Smear Review: NORMAL
WBC: 13.5 10*3/uL — ABNORMAL HIGH (ref 4.0–10.5)
nRBC: 0 % (ref 0.0–0.2)

## 2019-12-25 LAB — FERRITIN: Ferritin: 1244 ng/mL — ABNORMAL HIGH (ref 24–336)

## 2019-12-25 LAB — C-REACTIVE PROTEIN: CRP: 2.4 mg/dL — ABNORMAL HIGH (ref <1.0)

## 2019-12-25 LAB — FIBRIN DERIVATIVES D-DIMER (ARMC ONLY): Fibrin derivatives D-dimer (ARMC): 4336.52 ng/mL (FEU) — ABNORMAL HIGH (ref 0.00–499.00)

## 2019-12-25 MED ORDER — HYDROCODONE-HOMATROPINE 5-1.5 MG/5ML PO SYRP
5.0000 mL | ORAL_SOLUTION | ORAL | Status: DC
  Administered 2019-12-25 – 2020-01-02 (×44): 5 mL via ORAL
  Filled 2019-12-25 (×43): qty 5

## 2019-12-25 MED ORDER — FUROSEMIDE 10 MG/ML IJ SOLN
40.0000 mg | Freq: Every day | INTRAMUSCULAR | Status: DC
  Administered 2019-12-25 – 2020-01-03 (×10): 40 mg via INTRAVENOUS
  Filled 2019-12-25 (×11): qty 4

## 2019-12-25 MED ORDER — IOHEXOL 350 MG/ML SOLN
75.0000 mL | Freq: Once | INTRAVENOUS | Status: AC | PRN
  Administered 2019-12-25: 75 mL via INTRAVENOUS

## 2019-12-25 MED ORDER — OXYMETAZOLINE HCL 0.05 % NA SOLN
1.0000 | Freq: Two times a day (BID) | NASAL | Status: DC
  Administered 2019-12-25 – 2019-12-31 (×12): 1 via NASAL
  Filled 2019-12-25: qty 15

## 2019-12-25 MED ORDER — NYSTATIN 100000 UNIT/ML MT SUSP
5.0000 mL | Freq: Four times a day (QID) | OROMUCOSAL | Status: DC
  Administered 2019-12-25 – 2020-01-03 (×33): 500000 [IU] via ORAL
  Filled 2019-12-25 (×31): qty 5

## 2019-12-25 MED ORDER — SALINE SPRAY 0.65 % NA SOLN
1.0000 | NASAL | Status: DC | PRN
  Administered 2020-01-01: 1 via NASAL
  Filled 2019-12-25 (×3): qty 44

## 2019-12-25 NOTE — Consult Note (Signed)
Pulmonary Medicine          Date: 12/25/2019,   MRN# [id number] [name] 18-Sep-1970     AdmissionWeight: (!) 142.8 kg                 CurrentWeight: (!) 137.8 kg  Referring physician: Dr Allena Katz    CHIEF COMPLAINT:   Acute hypoxemic respiratory failure due to COVID19   HISTORY OF PRESENT ILLNESS    [name] is a 49 y.o. male with medical history significant for diabetes, hypertension and obesity, unvaccinated against Covid who presents to the emergency room with a several day history of fever cough and shortness of breath.  His wife has similar but milder symptoms.  Due to progressing symptoms came into the emergency room for evaluation.   On arrival he was febrile at 102.2 tachypneic at 22 with O2 sat 97% on room air but desaturating to 86 with ambulation.  BP 183/77.  Covid test positive, chest x-ray showed patchy bilateral airspace disease compatible with Covid pneumonia.  Patient started on usual treatment, oxygen.  Hospitalist consulted for admission.  Patient was then treated for hypertensive emergency. Patient had additional chest imaging with pneumomediastinum with pulmonary consultation placed for further evaluation and management.     PAST MEDICAL HISTORY   Past Medical History:  Diagnosis Date  . COVID-19 12/19/2019   Diagnosed at [facility] on 12/19/2019  . Diabetes mellitus without complication (HCC)   . History of tobacco use   . Hyperlipidemia   . Hypertension   . Obesity      SURGICAL HISTORY   History reviewed. No pertinent surgical history.   FAMILY HISTORY   Family History  Problem Relation Age of Onset  . Hypertension Mother   . Diabetes Mother   . Diabetes Maternal Grandmother      SOCIAL HISTORY   Social History   Tobacco Use  . Smoking status: Former Smoker    Types: Cigarettes  . Smokeless tobacco: Never Used  . Tobacco comment: occasional  Vaping Use  . Vaping Use: Never used  Substance Use Topics  . Alcohol  use: No    Alcohol/week: 0.0 standard drinks  . Drug use: No     MEDICATIONS    Home Medication:    Current Medication:  Current Facility-Administered Medications:  .  acetaminophen (TYLENOL) tablet 650 mg, 650 mg, Oral, Q6H PRN, Andris Baumann, MD, 650 mg at 12/20/19 2348 .  albuterol (VENTOLIN HFA) 108 (90 Base) MCG/ACT inhaler 2 puff, 2 puff, Inhalation, Q6H, Andris Baumann, MD, 2 puff at 12/25/19 [phone]  amLODipine (NORVASC) tablet 5 mg, 5 mg, Oral, Daily, Rolly Salter, MD, 5 mg at 12/25/19 1191 .  ascorbic acid (VITAMIN C) tablet 500 mg, 500 mg, Oral, Daily, Lindajo Royal V, MD, 500 mg at 12/25/19 0824 .  baricitinib (OLUMIANT) tablet 4 mg, 4 mg, Oral, Daily, Lindajo Royal V, MD, 4 mg at 12/25/19 1016 .  chlorhexidine (PERIDEX) 0.12 % solution 15 mL, 15 mL, Mouth/Throat, BID, Rolly Salter, MD, 15 mL at 12/25/19 4782 .  chlorpheniramine-HYDROcodone (TUSSIONEX) 10-8 MG/5ML suspension 5 mL, 5 mL, Oral, Q12H PRN, Andris Baumann, MD, 5 mL at 12/22/19 2137 .  cholecalciferol (VITAMIN D3) tablet 1,000 Units, 1,000 Units, Oral, Daily, Rolly Salter, MD, 1,000 Units at 12/25/19 0827 .  cloNIDine (CATAPRES) tablet 0.1 mg, 0.1 mg, Oral, Daily, Rolly Salter, MD, 0.1 mg at 12/25/19 9562 .  enoxaparin (LOVENOX) injection  40 mg, 40 mg, Subcutaneous, BID, Andris Baumannuncan, Hazel V, MD, 40 mg at 12/25/19 [id number] .  guaiFENesin-dextromethorphan (ROBITUSSIN DM) 100-10 MG/5ML syrup 10 mL, 10 mL, Oral, Q4H PRN, Lindajo Royaluncan, Hazel V, MD, 10 mL at 12/20/19 2220 .  hydrALAZINE (APRESOLINE) tablet 25 mg, 25 mg, Oral, Q6H PRN, Rolly SalterPatel, Pranav M, MD, 25 mg at 12/21/19 1739 .  spironolactone (ALDACTONE) tablet 25 mg, 25 mg, Oral, Daily, 25 mg at 12/25/19 0823 **AND** hydrochlorothiazide (HYDRODIURIL) tablet 25 mg, 25 mg, Oral, Daily, Rolly SalterPatel, Pranav M, MD, 25 mg at 12/25/19 [id number] .  HYDROcodone-acetaminophen (NORCO/VICODIN) 5-325 MG per tablet 1-2 tablet, 1-2 tablet, Oral, Q4H PRN, Lindajo Royaluncan, Hazel V, MD .  insulin aspart  (novoLOG) injection 0-20 Units, 0-20 Units, Subcutaneous, TID WC, Andris Baumannuncan, Hazel V, MD, 7 Units at 12/25/19 1210 .  insulin aspart (novoLOG) injection 0-5 Units, 0-5 Units, Subcutaneous, QHS, Andris Baumannuncan, Hazel V, MD, 2 Units at 12/22/19 2142 .  insulin aspart (novoLOG) injection 10 Units, 10 Units, Subcutaneous, TID WC, Rolly SalterPatel, Pranav M, MD, 10 Units at 12/25/19 1210 .  insulin detemir (LEVEMIR) injection 24 Units, 24 Units, Subcutaneous, BID, Rolly SalterPatel, Pranav M, MD, 24 Units at 12/25/19 1211 .  labetalol (NORMODYNE) injection 5 mg, 5 mg, Intravenous, Q2H PRN, Rolly SalterPatel, Pranav M, MD, 5 mg at 12/21/19 1547 .  linagliptin (TRADJENTA) tablet 5 mg, 5 mg, Oral, Daily, Lindajo Royaluncan, Hazel V, MD, 5 mg at 12/25/19 [phone] .  melatonin tablet 5 mg, 5 mg, Oral, QHS, Manuela SchwartzMorrison, Brenda, NP, 5 mg at 12/24/19 2232 .  menthol-cetylpyridinium (CEPACOL) lozenge 3 mg, 1 lozenge, Oral, PRN, Rolly SalterPatel, Pranav M, MD .  methylPREDNISolone sodium succinate (SOLU-MEDROL) 125 mg/2 mL injection 60 mg, 60 mg, Intravenous, BID, Rolly SalterPatel, Pranav M, MD, 60 mg at 12/25/19 [id number] .  multivitamin with minerals tablet 1 tablet, 1 tablet, Oral, Daily, Andris Baumannuncan, Hazel V, MD, 1 tablet at 12/25/19 [phone] .  nystatin (MYCOSTATIN) 100000 UNIT/ML suspension 500,000 Units, 5 mL, Oral, QID, Rolly SalterPatel, Pranav M, MD, 500,000 Units at 12/25/19 1212 .  ondansetron (ZOFRAN) tablet 4 mg, 4 mg, Oral, Q6H PRN **OR** ondansetron (ZOFRAN) injection 4 mg, 4 mg, Intravenous, Q6H PRN, Lindajo Royaluncan, Hazel V, MD .  oxymetazoline (AFRIN) 0.05 % nasal spray 1 spray, 1 spray, Each Nare, BID, Rolly SalterPatel, Pranav M, MD, 1 spray at 12/25/19 1212 .  phenol (CHLORASEPTIC) mouth spray 1 spray, 1 spray, Mouth/Throat, PRN, Rolly SalterPatel, Pranav M, MD, 1 spray at 12/23/19 1104 .  protein supplement (ENSURE MAX) liquid, 11 oz, Oral, TID, Rolly SalterPatel, Pranav M, MD, 11 oz at 12/24/19 2232 .  sodium chloride (OCEAN) 0.65 % nasal spray 1 spray, 1 spray, Each Nare, PRN, Rolly SalterPatel, Pranav M, MD .  sodium chloride flush (NS) 0.9 % injection 10  mL, 10 mL, Intravenous, Q12H, Rolly SalterPatel, Pranav M, MD, 10 mL at 12/25/19 0826 .  zinc sulfate capsule 220 mg, 220 mg, Oral, Daily, Lindajo Royaluncan, Hazel V, MD, 220 mg at 12/25/19 0825    ALLERGIES   Patient has no known allergies.     REVIEW OF SYSTEMS    Review of Systems:  Gen:  Denies  fever, sweats, chills weigh loss  HEENT: Denies blurred vision, double vision, ear pain, eye pain, hearing loss, nose bleeds, sore throat Cardiac:  No dizziness, chest pain or heaviness, chest tightness,edema Resp:   Cough, dyspnea+ Gi: Denies swallowing difficulty, stomach pain, nausea or vomiting, diarrhea, constipation, bowel incontinence Gu:  Denies bladder incontinence, burning urine Ext:   Denies Joint pain, stiffness or swelling Skin: Denies  skin rash,  easy bruising or bleeding or hives Endoc:  Denies polyuria, polydipsia , polyphagia or weight change Psych:   Denies depression, insomnia or hallucinations   Other:  All other systems negative   VS: BP 139/88 (BP Location: Right Arm)   Pulse 83   Temp 98.2 F (36.8 C) (Oral)   Resp 20   Ht 6' (1.829 m)   Wt (!) 137.8 kg   SpO2 98%   BMI 41.19 kg/m      PHYSICAL EXAM    GENERAL:NAD, no fevers, chills, no weakness no fatigue HEAD: Normocephalic, atraumatic.  EYES: Pupils equal, round, reactive to light. Extraocular muscles intact. No scleral icterus.  MOUTH: Moist mucosal membrane. Dentition intact. No abscess noted.  EAR, NOSE, THROAT: Clear without exudates. No external lesions.  NECK: Supple. No thyromegaly. No nodules. No JVD.  PULMONARY:bilateral rhonchi, difficult to appreciate auscultaion due to negativepressure fans CARDIOVASCULAR: S1 and S2. Regular rate and rhythm. No murmurs, rubs, or gallops. No edema. Pedal pulses 2+ bilaterally.  GASTROINTESTINAL: Soft, nontender, nondistended. No masses. Positive bowel sounds. No hepatosplenomegaly.  MUSCULOSKELETAL: No swelling, clubbing, or edema. Range of motion full in all  extremities.  NEUROLOGIC: Cranial nerves II through XII are intact. No gross focal neurological deficits. Sensation intact. Reflexes intact.  SKIN: No ulceration, lesions, rashes, or cyanosis. Skin warm and dry. Turgor intact.  PSYCHIATRIC: Mood, affect within normal limits. The patient is awake, alert and oriented x 3. Insight, judgment intact.       IMAGING    DG Chest 1 View  Result Date: 12/19/2019 CLINICAL DATA:  Fever, cough, COVID EXAM: CHEST  1 VIEW COMPARISON:  None. FINDINGS: Heart is normal size. Patchy bilateral airspace opacities. No effusions or pneumothorax. No acute bony abnormality. IMPRESSION: Patchy bilateral airspace disease compatible with COVID pneumonia. Electronically Signed   By: Charlett Nose M.D.   On: 12/19/2019 23:20   CT ANGIO CHEST PE W OR WO CONTRAST  Result Date: 12/25/2019 CLINICAL DATA:  Worsening hypoxia, COVID-19 positive EXAM: CT ANGIOGRAPHY CHEST WITH CONTRAST TECHNIQUE: Multidetector CT imaging of the chest was performed using the standard protocol during bolus administration of intravenous contrast. Multiplanar CT image reconstructions and MIPs were obtained to evaluate the vascular anatomy. CONTRAST:  70mL OMNIPAQUE IOHEXOL 350 MG/ML SOLN COMPARISON:  None. FINDINGS: Cardiovascular: Satisfactory opacification of the pulmonary arteries to the segmental level. No evidence of pulmonary embolism. Normal heart size. No pericardial effusion. Mediastinum/Nodes: No enlarged mediastinal, hilar, or axillary lymph nodes. Thyroid gland, trachea, and esophagus demonstrate no significant findings. Extensive pneumomediastinum dissecting into the neck, right worse than left. Lungs/Pleura: No pleural effusion or pneumothorax. Bilateral patchy ground-glass opacities and airspace disease throughout the lungs consistent with patient's known history of COVID-19 pneumonia. Upper Abdomen: No acute abnormality. Musculoskeletal: No chest wall abnormality. No acute or significant  osseous findings. Review of the MIP images confirms the above findings. IMPRESSION: 1. No evidence of pulmonary embolus. 2. Extensive pneumomediastinum dissecting into the neck, right worse than left. 3. Bilateral patchy ground-glass opacities and airspace disease throughout the lungs consistent with patient's known history of COVID-19 pneumonia. Electronically Signed   By: Elige Ko   On: 12/25/2019 15:04      ASSESSMENT/PLAN   Spontaneous pneumomediastinum - decrease shearing forces to minimize progression - will add scheduled Hycodan for now, will d/c vicodin to prevent overtreatment adverse effect such as respiratory depression   - supportive care only   - may pause bronchopulmonary hygiene such as vest therapy/incentive spirometery/ etc.   -  If progressive and severe will review with thoracic surgery    Acute COVID19 pneumonia -Remdesevir antiviral - pharmacy protocol 5 d -vitamin C -zinc -solumedrol - currently at 60 bid -Diuresis - Lasix 40 IV daily - monitor UOP - utilize external urinary catheter if possible -Self prone if patient can tolerate  -d/c hepatotoxic medications while on remdesevir -supportive care with ICU telemetry monitoring -PT/OT when possible -procalcitonin, CRP and ferritin trending -consider d/c baricitinib due to transamitis     Thank you for allowing me to participate in the care of this patient.   Patient/Family are satisfied with care plan and all questions have been answered.   This document was prepared using Dragon voice recognition software and may include unintentional dictation errors.     Vida Rigger, M.D.  Division of Pulmonary & Critical Care Medicine  [facility]

## 2019-12-25 NOTE — Progress Notes (Signed)
Patient has been instructed NOT to use IS at this time based on CT chest scan results.  Pulmonary consult pending.

## 2019-12-25 NOTE — Progress Notes (Signed)
Per CT, patient needs new IV - a 20 gauge above the wrist.  IV team consulted for this patient.

## 2019-12-25 NOTE — Progress Notes (Signed)
[facility] Progress Note  Patient: [name]    ACZ:[id number]  DOA: 12/19/2019     Date of Service: the patient was seen and examined on 12/25/2019  Brief hospital course: Past medical history of morbid obesity. Patient does not take any medications for diabetes or hypertension. Presents with shortness of breath and found to have COVID-19 pneumonia as well as hypertensive urgency. Currently plan is continue current care.  Assessment and Plan: 1. Acute hypoxic respiratory failure Acute COVID-19 Viral Pneumonia Pneumoperitoneum.  CXR: hazy bilateral peripheral opacities CT PE protocol on 12/25/2019 shows extensive pneumoperitoneum along with extensive groundglass opacity.  Negative for PE. Oxygen requirements:   Currently on 15 L of oxygen saturating 90%.  Occasionally requiring NRB CRP: 13.5 to 1.9 Remdesivir: Started on 12/19/2019 Steroids: Started on 12/19/2019 with Decadron. Currently on Solu-Medrol 60 mg twice daily.  Baricitinib/Actemra(off-label use): Also started on 12/19/2019. The investigational nature of this medication was discussed with the patient/HCPOA and they choose to proceed as the potential benefits are felt to outweigh risks at this time.  Antibiotics:none Vitamin C and Zinc: continue DVT Prophylaxis: enoxaparin (LOVENOX) injection 40 mg Start: 12/20/19 0215 Prone positioning: Patient encouraged to stay in prone position as much as possible. Currently awaiting improvement in oxygenation.    Pulmonary consulted for pneumoperitoneum.  We will follow recommendation.  The treatment plan and use of medications and known side effects were discussed with patient/family. It was clearly explained that Complete risks and long-term side effects are unknown, however in the best clinical judgment they seem to be of some clinical benefit rather than medical risks. Patient/family agree with the treatment plan and want to receive these treatments as indicated.   2.  Hypertensive urgency Blood pressure significantly elevated on admission, Beyond 200 systolic.  Now better. Continue current regimen of clonidine, Norvasc, HCTZ and Aldactone as well as as needed hydralazine. Monitor electrolytes.  3. Hypokalemia Corrected. Monitor.  4. Type 2 diabetes mellitus, uncontrolled with hyperglycemia Sugar significantly elevated. A1c 10.5. Continue current regimen for now. Patient has been informed that he will be discharged with insulin given his high A1c.  5. Elevated LFT In the setting of COVID-19. Monitor.  6. Leukopenia In the setting of COVID-19. Monitor.  7. Obesity, Putting the patient at high risk for worsening respiratory illness from COVID-19. Patient reports having a negative sleep study 2 years ago at home. Monitor. Body mass index is 41.19 kg/m.   8. Oral ulcer Patient has seen urgent care for the same. Continuing chlorhexidine and Magic mouthwash with lidocaine. Outpatient ENT follow-up recommended.  9.  Elevated D-dimer. D-dimer is trending upwards despite improvement in other inflammatory markers. At present patient does not have any new hypotension or worsening hypoxia or tachycardia. CT PE protocol is also negative for pulmonary bleeding.  10.  Oral thrush. Nystatin Added.  11.  Epistaxis. Afrin spray scheduled.  Also Ocean Spray.  Currently resolved.   Diet: Cardiac and carb modified diet DVT Prophylaxis:   enoxaparin (LOVENOX) injection 40 mg Start: 12/20/19 0215    Advance goals of care discussion: Full code  Family Communication: no family was present at bedside, at the time of interview.   Disposition:  Status is: Inpatient  Remains inpatient appropriate because:IV treatments appropriate due to intensity of illness or inability to take PO   Dispo: The patient is from: Home              Anticipated d/c is to: Home  Anticipated d/c date is: > 3 days              Patient currently is not  medically stable to d/c.  Subjective: Continues to have fatigue and tiredness.  Also has cough.  Some chest pain.  Reports some nosebleed.  No fever no chills.  Physical Exam: General: Appear in moderate distress, no Rash; Oral Mucosa Clear, moist. no Abnormal Neck Mass Or lumps, Conjunctiva normal  Cardiovascular: S1 and S2 Present, no Murmur, Respiratory: increased respiratory effort, Bilateral Air entry present and bilateral  Crackles, no wheezes Abdomen: Bowel Sound present, Soft and no tenderness Extremities: no Pedal edema, no calf tenderness Neurology: alert and oriented to time, place, and person affect appropriate. no new focal deficit Gait not checked due to patient safety concerns  Vitals:   12/25/19 0800 12/25/19 0954 12/25/19 1200 12/25/19 1648  BP: 135/84  139/88 (!) 141/84  Pulse: 90  92 79  Resp:    19  Temp: 98 F (36.7 C)  98 F (36.7 C) 98.3 F (36.8 C)  TempSrc:      SpO2:  98% 98% 100%  Weight:      Height:        Intake/Output Summary (Last 24 hours) at 12/25/2019 1921 Last data filed at 12/25/2019 5277 Gross per 24 hour  Intake 0 ml  Output --  Net 0 ml   Filed Weights   12/22/19 0433 12/23/19 0540 12/25/19 0314  Weight: (!) 140.9 kg (!) 139.6 kg (!) 137.8 kg    Data Reviewed: I have personally reviewed and interpreted daily labs, tele strips, imagings as discussed above. I reviewed all nursing notes, pharmacy notes, vitals, pertinent old records I have discussed plan of care as described above with RN and patient/family.  CBC: Recent Labs  Lab 12/21/19 0456 12/22/19 0602 12/23/19 0530 12/24/19 0628 12/25/19 0611  WBC 5.7 8.0 11.9* 13.1* 13.5*  NEUTROABS 4.9 6.9 10.0* 10.6* 11.5*  HGB 14.7 14.6 14.7 15.4 15.8  HCT 44.1 43.4 43.3 46.2 46.6  MCV 82.6 82.5 82.3 81.9 82.0  PLT 159 204 228 270 305   Basic Metabolic Panel: Recent Labs  Lab 12/21/19 0456 12/22/19 0602 12/23/19 0530 12/24/19 0628 12/25/19 0611  NA 136 136 135 134* 135   K 4.0 4.2 4.0 3.8 4.0  CL 102 98 99 96* 96*  CO2 24 26 27 26 28   GLUCOSE 297* 327* 257* 240* 185*  BUN 15 23* 24* 26* 20  CREATININE 0.91 1.02 0.96 0.85 0.99  CALCIUM 8.0* 8.7* 8.4* 8.4* 8.6*  MG 2.0 2.7* 2.3 2.5* 2.2    Studies: CT ANGIO CHEST PE W OR WO CONTRAST  Result Date: 12/25/2019 CLINICAL DATA:  Worsening hypoxia, COVID-19 positive EXAM: CT ANGIOGRAPHY CHEST WITH CONTRAST TECHNIQUE: Multidetector CT imaging of the chest was performed using the standard protocol during bolus administration of intravenous contrast. Multiplanar CT image reconstructions and MIPs were obtained to evaluate the vascular anatomy. CONTRAST:  57mL OMNIPAQUE IOHEXOL 350 MG/ML SOLN COMPARISON:  None. FINDINGS: Cardiovascular: Satisfactory opacification of the pulmonary arteries to the segmental level. No evidence of pulmonary embolism. Normal heart size. No pericardial effusion. Mediastinum/Nodes: No enlarged mediastinal, hilar, or axillary lymph nodes. Thyroid gland, trachea, and esophagus demonstrate no significant findings. Extensive pneumomediastinum dissecting into the neck, right worse than left. Lungs/Pleura: No pleural effusion or pneumothorax. Bilateral patchy ground-glass opacities and airspace disease throughout the lungs consistent with patient's known history of COVID-19 pneumonia. Upper Abdomen: No acute abnormality. Musculoskeletal: No chest  wall abnormality. No acute or significant osseous findings. Review of the MIP images confirms the above findings. IMPRESSION: 1. No evidence of pulmonary embolus. 2. Extensive pneumomediastinum dissecting into the neck, right worse than left. 3. Bilateral patchy ground-glass opacities and airspace disease throughout the lungs consistent with patient's known history of COVID-19 pneumonia. Electronically Signed   By: Elige Ko   On: 12/25/2019 15:04    Scheduled Meds: . albuterol  2 puff Inhalation Q6H  . amLODipine  5 mg Oral Daily  . vitamin C  500 mg Oral  Daily  . baricitinib  4 mg Oral Daily  . chlorhexidine  15 mL Mouth/Throat BID  . cholecalciferol  1,000 Units Oral Daily  . cloNIDine  0.1 mg Oral Daily  . enoxaparin (LOVENOX) injection  40 mg Subcutaneous BID  . spironolactone  25 mg Oral Daily   And  . hydrochlorothiazide  25 mg Oral Daily  . insulin aspart  0-20 Units Subcutaneous TID WC  . insulin aspart  0-5 Units Subcutaneous QHS  . insulin aspart  10 Units Subcutaneous TID WC  . insulin detemir  24 Units Subcutaneous BID  . linagliptin  5 mg Oral Daily  . melatonin  5 mg Oral QHS  . methylPREDNISolone (SOLU-MEDROL) injection  60 mg Intravenous BID  . multivitamin with minerals  1 tablet Oral Daily  . nystatin  5 mL Oral QID  . oxymetazoline  1 spray Each Nare BID  . Ensure Max Protein  11 oz Oral TID  . sodium chloride flush  10 mL Intravenous Q12H  . zinc sulfate  220 mg Oral Daily   Continuous Infusions:  PRN Meds: acetaminophen, chlorpheniramine-HYDROcodone, guaiFENesin-dextromethorphan, hydrALAZINE, HYDROcodone-acetaminophen, labetalol, menthol-cetylpyridinium, ondansetron **OR** ondansetron (ZOFRAN) IV, phenol, sodium chloride  Time spent: 35 minutes  Author: Lynden Oxford, MD [facility] 12/25/2019 7:21 PM  To reach On-call, see care teams to locate the attending and reach out via [email or web address]. Between 7PM-7AM, please contact night-coverage If you still have difficulty reaching the attending provider, please page the [facility] (Director on Call) for [facility] on amion for assistance.

## 2019-12-25 NOTE — Progress Notes (Signed)
Inpatient Diabetes Program Recommendations  AACE/ADA: New Consensus Statement on Inpatient Glycemic Control   Target Ranges:  Prepandial:   less than 140 mg/dL      Peak postprandial:   less than 180 mg/dL (1-2 hours)      Critically ill patients:  140 - 180 mg/dL   Results for MAHMOUD, BLAZEJEWSKI (MRN [id number]) as of 12/25/2019 09:08  Ref. Range 12/24/2019 09:11 12/24/2019 12:45 12/24/2019 15:57 12/24/2019 20:49 12/25/2019 08:20  Glucose-Capillary Latest Ref Range: 70 - 99 mg/dL 163 (H) 846 (H) 659 (H) 106 (H) 219 (H)   Review of Glycemic Control  Diabetes history: DM2 Outpatient Diabetes medications: None Current orders for Inpatient glycemic control: Levemir 24 units BID, Novolog 10 units TID with meals, Novolog 0-20 units TID with meals, Novolog 0-5 units QHS, Tradjenta 5 mg daily; Solumedrol 60 mg BID  Inpatient Diabetes Program Recommendations:    Insulin: If steroids will be continued as ordered, please consider increasing meal coverage to Novolog 15 units TID with meals.  Thanks, Orlando Penner, RN, MSN, CDE Diabetes Coordinator Inpatient Diabetes Program [phone] (Team Pager from 8am to 5pm)

## 2019-12-25 NOTE — TOC Progression Note (Signed)
Transition of Care [facility]) - Progression Note    Patient Details  Name: [name] MRN: [id number] Date of Birth: 1970/06/15  [facility] CM/SW Contact  Eilleen Kempf, LCSW Phone Number: 12/25/2019, 3:27 PM  Clinical Narrative:    CSW is aware of [facility] consult request in for medication assistance for this patient. The patient has Nurse, learning disability with pharmacy benefits. If there is a specific medication needed that is in question, CSW will be glad to check with patient's insurance concerning coverage and or copay cost.      Expected Discharge Plan and Services                                                 Social Determinants of Health (SDOH) Interventions    Readmission Risk Interventions No flowsheet data found.

## 2019-12-26 LAB — CBC WITH DIFFERENTIAL/PLATELET
Abs Immature Granulocytes: 0.33 10*3/uL — ABNORMAL HIGH (ref 0.00–0.07)
Basophils Absolute: 0 10*3/uL (ref 0.0–0.1)
Basophils Relative: 0 %
Eosinophils Absolute: 0 10*3/uL (ref 0.0–0.5)
Eosinophils Relative: 0 %
HCT: 45.3 % (ref 39.0–52.0)
Hemoglobin: 15.2 g/dL (ref 13.0–17.0)
Immature Granulocytes: 3 %
Lymphocytes Relative: 7 %
Lymphs Abs: 0.7 10*3/uL (ref 0.7–4.0)
MCH: 27.5 pg (ref 26.0–34.0)
MCHC: 33.6 g/dL (ref 30.0–36.0)
MCV: 82.1 fL (ref 80.0–100.0)
Monocytes Absolute: 0.3 10*3/uL (ref 0.1–1.0)
Monocytes Relative: 3 %
Neutro Abs: 9.2 10*3/uL — ABNORMAL HIGH (ref 1.7–7.7)
Neutrophils Relative %: 87 %
Platelets: 310 10*3/uL (ref 150–400)
RBC: 5.52 MIL/uL (ref 4.22–5.81)
RDW: 15.2 % (ref 11.5–15.5)
WBC: 10.5 10*3/uL (ref 4.0–10.5)
nRBC: 0 % (ref 0.0–0.2)

## 2019-12-26 LAB — GLUCOSE, CAPILLARY
Glucose-Capillary: 203 mg/dL — ABNORMAL HIGH (ref 70–99)
Glucose-Capillary: 214 mg/dL — ABNORMAL HIGH (ref 70–99)
Glucose-Capillary: 146 mg/dL — ABNORMAL HIGH (ref 70–99)

## 2019-12-26 LAB — COMPREHENSIVE METABOLIC PANEL
ALT: 103 U/L — ABNORMAL HIGH (ref 0–44)
AST: 53 U/L — ABNORMAL HIGH (ref 15–41)
Albumin: 3.2 g/dL — ABNORMAL LOW (ref 3.5–5.0)
Alkaline Phosphatase: 71 U/L (ref 38–126)
Anion gap: 10 (ref 5–15)
BUN: 27 mg/dL — ABNORMAL HIGH (ref 6–20)
CO2: 28 mmol/L (ref 22–32)
Calcium: 8.5 mg/dL — ABNORMAL LOW (ref 8.9–10.3)
Chloride: 95 mmol/L — ABNORMAL LOW (ref 98–111)
Creatinine, Ser: 1 mg/dL (ref 0.61–1.24)
GFR calc Af Amer: >60 mL/min (ref >60)
GFR calc non Af Amer: >60 mL/min (ref >60)
Glucose, Bld: 226 mg/dL — ABNORMAL HIGH (ref 70–99)
Potassium: 4.2 mmol/L (ref 3.5–5.1)
Sodium: 133 mmol/L — ABNORMAL LOW (ref 135–145)
Total Bilirubin: 1 mg/dL (ref 0.3–1.2)
Total Protein: 6.9 g/dL (ref 6.5–8.1)

## 2019-12-26 LAB — MAGNESIUM: Magnesium: 2.3 mg/dL (ref 1.7–2.4)

## 2019-12-26 LAB — C-REACTIVE PROTEIN: CRP: 5 mg/dL — ABNORMAL HIGH (ref <1.0)

## 2019-12-26 LAB — FIBRIN DERIVATIVES D-DIMER (ARMC ONLY): Fibrin derivatives D-dimer (ARMC): 3023.69 ng/mL (FEU) — ABNORMAL HIGH (ref 0.00–499.00)

## 2019-12-26 MED ORDER — INSULIN DETEMIR 100 UNIT/ML ~~LOC~~ SOLN
26.0000 [IU] | Freq: Two times a day (BID) | SUBCUTANEOUS | Status: DC
  Administered 2019-12-26 – 2019-12-31 (×10): 26 [IU] via SUBCUTANEOUS
  Filled 2019-12-26 (×11): qty 0.26

## 2019-12-26 MED ORDER — METHYLPREDNISOLONE SODIUM SUCC 40 MG IJ SOLR
40.0000 mg | Freq: Two times a day (BID) | INTRAMUSCULAR | Status: DC
  Administered 2019-12-26 – 2019-12-27 (×2): 40 mg via INTRAVENOUS
  Filled 2019-12-26 (×2): qty 1

## 2019-12-26 NOTE — Plan of Care (Signed)
  Problem: Education: Goal: Knowledge of General Education information will improve Description Including pain rating scale, medication(s)/side effects and non-pharmacologic comfort measures Outcome: Progressing   

## 2019-12-26 NOTE — Plan of Care (Signed)
  Problem: Education: Goal: Knowledge of General Education information will improve Description: Including pain rating scale, medication(s)/side effects and non-pharmacologic comfort measures Outcome: Progressing   Problem: Coping: Goal: Psychosocial and spiritual needs will be supported Outcome: Progressing   Problem: Respiratory: Goal: Will maintain a patent airway Outcome: Progressing Goal: Complications related to the disease process, condition or treatment will be avoided or minimized Outcome: Progressing

## 2019-12-26 NOTE — Progress Notes (Signed)
[facility] Progress Note  Patient: [name]    MOQ:[id number]  DOA: 12/19/2019     Date of Service: the patient was seen and examined on 12/26/2019  Brief hospital course: Past medical history of morbid obesity. Patient does not take any medications for diabetes or hypertension. Presents with shortness of breath and found to have COVID-19 pneumonia as well as hypertensive urgency. Currently plan is continue current care for hypoxia.  Assessment and Plan: 1. Acute hypoxic respiratory failure Acute COVID-19 Viral Pneumonia Pneumoperitoneum.  CXR: hazy bilateral peripheral opacities CT PE protocol on 12/25/2019 shows extensive pneumoperitoneum along with extensive groundglass opacity.  Negative for PE. Pulmonary consulted for pneumoperitoneum.  Currently conservative measures with close monitoring.  No incentive spirometry.  Oxygen requirements Currently on 15 L of oxygen and NRB CRP: 13.5 to 1.3, now again trending to 5.0. Remdesivir: 5-day course completed on 12/24/2019. Steroids: Started on 12/19/2019 with Decadron. Currently on Solu-Medrol 60 mg twice daily.  Baricitinib/Actemra(off-label use): Also started on 12/19/2019.  The investigational nature of this medication was discussed with the patient/HCPOA and they choose to proceed as the potential benefits are felt to outweigh risks at this time.   Antibiotics:none Vitamin C and Zinc: continue DVT Prophylaxis: enoxaparin (LOVENOX) injection 40 mg Start: 12/20/19 0215 Prone positioning: Patient encouraged to stay in prone position as much as possible.  The treatment plan and use of medications and known side effects were discussed with patient/family. It was clearly explained that Complete risks and long-term side effects are unknown, however in the best clinical judgment they seem to be of some clinical benefit rather than medical risks. Patient/family agree with the treatment plan and want to receive these treatments as  indicated.   2. Hypertensive urgency Blood pressure significantly elevated on admission, Beyond 200 systolic.  Now better. Continue current regimen of clonidine, Norvasc, HCTZ and Aldactone as well as as needed hydralazine. Monitor electrolytes.  3. Hypokalemia Corrected. Monitor.  4. Type 2 diabetes mellitus, uncontrolled with hyperglycemia without complication Blood Sugar significantly elevated. A1c 10.5. Continue current regimen for now. Patient has been informed that he will be discharged with insulin given his high A1c.  5. Elevated LFT In the setting of COVID-19. Monitor.  6. Leukopenia In the setting of COVID-19. Monitor.  7. Obesity, Putting the patient at high risk for worsening respiratory illness from COVID-19. Patient reports having a negative sleep study 2 years ago at home. Body mass index is 40.23 kg/m.  Monitor.  8. Oral ulcer Patient has seen urgent care for the same. Continuing chlorhexidine and Magic mouthwash with lidocaine. Anticipating poor wound healing in the setting of patient being on steroid as well as baricitinib Discussed with ENT on-call, Outpatient ENT follow-up recommended.  9.  Elevated D-dimer. D-dimer is trending upwards despite improvement in other inflammatory markers. At present patient does not have any new hypotension or worsening hypoxia or tachycardia. CT PE protocol is also negative for pulmonary bleeding.  10.  Oral thrush. Nystatin Added.  11.  Epistaxis. Afrin spray scheduled.  Also Ocean Spray.  Currently resolved.  Diet: Cardiac and carb modified diet DVT Prophylaxis:   enoxaparin (LOVENOX) injection 40 mg Start: 12/20/19 0215  Advance goals of care discussion: Full code  Family Communication: no family was present at bedside, at the time of interview.   Disposition:  Status is: Inpatient  Remains inpatient appropriate because:Inpatient level of care appropriate due to severity of illness severe  hypoxia requiring nonrebreather as well as 15 L of  oxygen along with pneumomediastinum   Dispo: The patient is from: Home              Anticipated d/c is to: Home              Anticipated d/c date is: > 3 days              Patient currently is not medically stable to d/c.  Subjective: Continues to have shortness of breath.  No chest pain.  No nausea or vomiting.  Breathing actually somewhat better than yesterday.  No fever no chills.  Physical Exam:  General: Appear in moderate distress, no Rash; Oral Mucosa Clear, moist oral ulcer still unchanged and present. no Abnormal Neck Mass Or lumps, Conjunctiva normal  Cardiovascular: S1 and S2 Present, no Murmur, Respiratory: increased respiratory effort, Bilateral Air entry present and bilateral Crackles, no wheezes Abdomen: Bowel Sound sent, Soft and no tenderness Extremities: no Pedal edema, no calf tenderness Neurology: alert and oriented to time, place, and person affect appropriate. no new focal deficit Gait not checked due to patient safety concerns  Vitals:   12/26/19 0500 12/26/19 0809 12/26/19 0832 12/26/19 1450  BP:  135/80    Pulse:  83    Resp:  20    Temp:  97.8 F (36.6 C)    TempSrc:  Oral    SpO2:  97% 90% 96%  Weight: 134.5 kg     Height:        Intake/Output Summary (Last 24 hours) at 12/26/2019 1649 Last data filed at 12/26/2019 0448 Gross per 24 hour  Intake 720 ml  Output 975 ml  Net -255 ml   Filed Weights   12/23/19 0540 12/25/19 0314 12/26/19 0500  Weight: (!) 139.6 kg (!) 137.8 kg 134.5 kg    Data Reviewed: I have personally reviewed and interpreted daily labs, tele strips, imagings as discussed above. I reviewed all nursing notes, pharmacy notes, vitals, pertinent old records I have discussed plan of care as described above with RN and patient/family.  CBC: Recent Labs  Lab 12/22/19 0602 12/23/19 0530 12/24/19 0628 12/25/19 0611 12/26/19 1017  WBC 8.0 11.9* 13.1* 13.5* 10.5  NEUTROABS 6.9  10.0* 10.6* 11.5* 9.2*  HGB 14.6 14.7 15.4 15.8 15.2  HCT 43.4 43.3 46.2 46.6 45.3  MCV 82.5 82.3 81.9 82.0 82.1  PLT 204 228 270 305 310   Basic Metabolic Panel: Recent Labs  Lab 12/22/19 0602 12/23/19 0530 12/24/19 0628 12/25/19 0611 12/26/19 1017  NA 136 135 134* 135 133*  K 4.2 4.0 3.8 4.0 4.2  CL 98 99 96* 96* 95*  CO2 26 27 26 28 28   GLUCOSE 327* 257* 240* 185* 226*  BUN 23* 24* 26* 20 27*  CREATININE 1.02 0.96 0.85 0.99 1.00  CALCIUM 8.7* 8.4* 8.4* 8.6* 8.5*  MG 2.7* 2.3 2.5* 2.2 2.3    Studies: No results found.  Scheduled Meds: . albuterol  2 puff Inhalation Q6H  . amLODipine  5 mg Oral Daily  . vitamin C  500 mg Oral Daily  . baricitinib  4 mg Oral Daily  . chlorhexidine  15 mL Mouth/Throat BID  . cholecalciferol  1,000 Units Oral Daily  . enoxaparin (LOVENOX) injection  40 mg Subcutaneous BID  . furosemide  40 mg Intravenous Daily  . spironolactone  25 mg Oral Daily   And  . hydrochlorothiazide  25 mg Oral Daily  . HYDROcodone-homatropine  5 mL Oral Q4H  . insulin aspart  0-20  Units Subcutaneous TID WC  . insulin aspart  0-5 Units Subcutaneous QHS  . insulin aspart  10 Units Subcutaneous TID WC  . insulin detemir  26 Units Subcutaneous BID  . linagliptin  5 mg Oral Daily  . melatonin  5 mg Oral QHS  . methylPREDNISolone (SOLU-MEDROL) injection  40 mg Intravenous BID  . multivitamin with minerals  1 tablet Oral Daily  . nystatin  5 mL Oral QID  . oxymetazoline  1 spray Each Nare BID  . Ensure Max Protein  11 oz Oral TID  . sodium chloride flush  10 mL Intravenous Q12H  . zinc sulfate  220 mg Oral Daily   Continuous Infusions: PRN Meds: acetaminophen, chlorpheniramine-HYDROcodone, hydrALAZINE, labetalol, menthol-cetylpyridinium, ondansetron **OR** ondansetron (ZOFRAN) IV, phenol, sodium chloride  Time spent: 35 minutes  Author: Lynden Oxford, MD [facility] 12/26/2019 4:49 PM  To reach On-call, see care teams to locate the attending and  reach out via [email or web address]. Between 7PM-7AM, please contact night-coverage If you still have difficulty reaching the attending provider, please page the [facility] (Director on Call) for [facility] on amion for assistance.

## 2019-12-26 NOTE — Progress Notes (Signed)
Pulmonary Medicine          Date: 12/26/2019,   MRN# [id number] [name] December 10, 1970     AdmissionWeight: (!) 142.8 kg                 CurrentWeight: 134.5 kg  Referring physician: Dr Allena Katz    CHIEF COMPLAINT:   Acute hypoxemic respiratory failure due to COVID19   HISTORY OF PRESENT ILLNESS    [name] is a 49 y.o. male with medical history significant for diabetes, hypertension and obesity, unvaccinated against Covid who presents to the emergency room with a several day history of fever cough and shortness of breath.  His wife has similar but milder symptoms.  Due to progressing symptoms came into the emergency room for evaluation.   On arrival he was febrile at 102.2 tachypneic at 22 with O2 sat 97% on room air but desaturating to 86 with ambulation.  BP 183/77.  Covid test positive, chest x-ray showed patchy bilateral airspace disease compatible with Covid pneumonia.  Patient started on usual treatment, oxygen.  Hospitalist consulted for admission.  Patient was then treated for hypertensive emergency. Patient had additional chest imaging with pneumomediastinum with pulmonary consultation placed for further evaluation and management.     12/26/19- patient is in no distress, we discussed pneumomediastinum. He is feels cough is improved with hycodan. Continue current care plan.    PAST MEDICAL HISTORY   Past Medical History:  Diagnosis Date  . COVID-19 12/19/2019   Diagnosed at [facility] on 12/19/2019  . Diabetes mellitus without complication (HCC)   . History of tobacco use   . Hyperlipidemia   . Hypertension   . Obesity      SURGICAL HISTORY   History reviewed. No pertinent surgical history.   FAMILY HISTORY   Family History  Problem Relation Age of Onset  . Hypertension Mother   . Diabetes Mother   . Diabetes Maternal Grandmother      SOCIAL HISTORY   Social History   Tobacco Use  . Smoking status: Former Smoker    Types: Cigarettes   . Smokeless tobacco: Never Used  . Tobacco comment: occasional  Vaping Use  . Vaping Use: Never used  Substance Use Topics  . Alcohol use: No    Alcohol/week: 0.0 standard drinks  . Drug use: No     MEDICATIONS    Home Medication:    Current Medication:  Current Facility-Administered Medications:  .  acetaminophen (TYLENOL) tablet 650 mg, 650 mg, Oral, Q6H PRN, Andris Baumann, MD, 650 mg at 12/20/19 2348 .  albuterol (VENTOLIN HFA) 108 (90 Base) MCG/ACT inhaler 2 puff, 2 puff, Inhalation, Q6H, Andris Baumann, MD, 2 puff at 12/26/19 1424 .  amLODipine (NORVASC) tablet 5 mg, 5 mg, Oral, Daily, Rolly Salter, MD, 5 mg at 12/26/19 1019 .  ascorbic acid (VITAMIN C) tablet 500 mg, 500 mg, Oral, Daily, Lindajo Royal V, MD, 500 mg at 12/26/19 1019 .  baricitinib (OLUMIANT) tablet 4 mg, 4 mg, Oral, Daily, Lindajo Royal V, MD, 4 mg at 12/26/19 1024 .  chlorhexidine (PERIDEX) 0.12 % solution 15 mL, 15 mL, Mouth/Throat, BID, Rolly Salter, MD, 15 mL at 12/25/19 2359 .  chlorpheniramine-HYDROcodone (TUSSIONEX) 10-8 MG/5ML suspension 5 mL, 5 mL, Oral, Q12H PRN, Andris Baumann, MD, 5 mL at 12/25/19 2000 .  cholecalciferol (VITAMIN D3) tablet 1,000 Units, 1,000 Units, Oral, Daily, Rolly Salter, MD, 1,000 Units at 12/26/19 1019 .  enoxaparin (LOVENOX) injection 40 mg, 40 mg, Subcutaneous, BID, Duncan, Hazel V, MD, 40 mg at 12/26/19 1020 .  furosemide (LASIX) injection 40 mg, 40 mg, Intravenous, Daily, Karna ChristmasAleskerov, Kashina Mecum, MD, 40 mg at 12/26/19 1019 .  hydrALAZINE (APRESOLINE) tablet 25 mg, 25 mg, Oral, Q6H PRN, Rolly SalterPatel, Pranav M, MLindajo Royal, 25 mg at 12/21/19 1739 .  spironolactone (ALDACTONE) tablet 25 mg, 25 mg, Oral, Daily, 25 mg at 12/26/19 1019 **AND** hydrochlorothiazide (HYDRODIURIL) tablet 25 mg, 25 mg, Oral, Daily, Rolly SalterPatel, Pranav M, MD, 25 mg at 12/26/19 1019 .  HYDROcodone-homatropine (HYCODAN) 5-1.5 MG/5ML syrup 5 mL, 5 mL, Oral, Q4H, Yazleen Molock, MD, 5 mL at 12/26/19 1425 .  insulin  aspart (novoLOG) injection 0-20 Units, 0-20 Units, Subcutaneous, TID WC, Andris Baumannuncan, Hazel V, MD, 7 Units at 12/26/19 1210 .  insulin aspart (novoLOG) injection 0-5 Units, 0-5 Units, Subcutaneous, QHS, Andris Baumannuncan, Hazel V, MD, 2 Units at 12/22/19 2142 .  insulin aspart (novoLOG) injection 10 Units, 10 Units, Subcutaneous, TID WC, Rolly SalterPatel, Pranav M, MD, 10 Units at 12/26/19 1212 .  insulin detemir (LEVEMIR) injection 26 Units, 26 Units, Subcutaneous, BID, Lynden OxfordPatel, Pranav M, MD .  labetalol (NORMODYNE) injection 5 mg, 5 mg, Intravenous, Q2H PRN, Rolly SalterPatel, Pranav M, MD, 5 mg at 12/21/19 1547 .  linagliptin (TRADJENTA) tablet 5 mg, 5 mg, Oral, Daily, Lindajo Royaluncan, Hazel V, MD, 5 mg at 12/26/19 1019 .  melatonin tablet 5 mg, 5 mg, Oral, QHS, Manuela SchwartzMorrison, Brenda, NP, 5 mg at 12/25/19 2358 .  menthol-cetylpyridinium (CEPACOL) lozenge 3 mg, 1 lozenge, Oral, PRN, Rolly SalterPatel, Pranav M, MD .  methylPREDNISolone sodium succinate (SOLU-MEDROL) 125 mg/2 mL injection 60 mg, 60 mg, Intravenous, BID, Rolly SalterPatel, Pranav M, MD, 60 mg at 12/26/19 1019 .  multivitamin with minerals tablet 1 tablet, 1 tablet, Oral, Daily, Lindajo Royaluncan, Hazel V, MD, 1 tablet at 12/26/19 1019 .  nystatin (MYCOSTATIN) 100000 UNIT/ML suspension 500,000 Units, 5 mL, Oral, QID, Rolly SalterPatel, Pranav M, MD, 500,000 Units at 12/26/19 1022 .  ondansetron (ZOFRAN) tablet 4 mg, 4 mg, Oral, Q6H PRN **OR** ondansetron (ZOFRAN) injection 4 mg, 4 mg, Intravenous, Q6H PRN, Andris Baumannuncan, Hazel V, MD, 4 mg at 12/26/19 1020 .  oxymetazoline (AFRIN) 0.05 % nasal spray 1 spray, 1 spray, Each Nare, BID, Rolly SalterPatel, Pranav M, MD, 1 spray at 12/26/19 1022 .  phenol (CHLORASEPTIC) mouth spray 1 spray, 1 spray, Mouth/Throat, PRN, Rolly SalterPatel, Pranav M, MD, 1 spray at 12/23/19 1104 .  protein supplement (ENSURE MAX) liquid, 11 oz, Oral, TID, Rolly SalterPatel, Pranav M, MD, 11 oz at 12/26/19 1425 .  sodium chloride (OCEAN) 0.65 % nasal spray 1 spray, 1 spray, Each Nare, PRN, Rolly SalterPatel, Pranav M, MD .  sodium chloride flush (NS) 0.9 % injection  10 mL, 10 mL, Intravenous, Q12H, Rolly SalterPatel, Pranav M, MD, 10 mL at 12/26/19 1022 .  zinc sulfate capsule 220 mg, 220 mg, Oral, Daily, Lindajo Royaluncan, Hazel V, MD, 220 mg at 12/26/19 1019    ALLERGIES   Patient has no known allergies.     REVIEW OF SYSTEMS    Review of Systems:  Gen:  Denies  fever, sweats, chills weigh loss  HEENT: Denies blurred vision, double vision, ear pain, eye pain, hearing loss, nose bleeds, sore throat Cardiac:  No dizziness, chest pain or heaviness, chest tightness,edema Resp:   Cough, dyspnea+ Gi: Denies swallowing difficulty, stomach pain, nausea or vomiting, diarrhea, constipation, bowel incontinence Gu:  Denies bladder incontinence, burning urine Ext:   Denies Joint pain, stiffness or swelling Skin: Denies  skin rash,  easy bruising or bleeding or hives Endoc:  Denies polyuria, polydipsia , polyphagia or weight change Psych:   Denies depression, insomnia or hallucinations   Other:  All other systems negative   VS: BP 135/80 (BP Location: Right Arm)   Pulse 83   Temp 97.8 F (36.6 C) (Oral)   Resp 20   Ht 6' (1.829 m)   Wt 134.5 kg   SpO2 96%   BMI 40.23 kg/m      PHYSICAL EXAM    GENERAL:NAD, no fevers, chills, no weakness no fatigue HEAD: Normocephalic, atraumatic.  EYES: Pupils equal, round, reactive to light. Extraocular muscles intact. No scleral icterus.  MOUTH: Moist mucosal membrane. Dentition intact. No abscess noted.  EAR, NOSE, THROAT: Clear without exudates. No external lesions.  NECK: Supple. No thyromegaly. No nodules. No JVD.  PULMONARY:bilateral rhonchi, difficult to appreciate auscultaion due to negativepressure fans CARDIOVASCULAR: S1 and S2. Regular rate and rhythm. No murmurs, rubs, or gallops. No edema. Pedal pulses 2+ bilaterally.  GASTROINTESTINAL: Soft, nontender, nondistended. No masses. Positive bowel sounds. No hepatosplenomegaly.  MUSCULOSKELETAL: No swelling, clubbing, or edema. Range of motion full in all  extremities.  NEUROLOGIC: Cranial nerves II through XII are intact. No gross focal neurological deficits. Sensation intact. Reflexes intact.  SKIN: No ulceration, lesions, rashes, or cyanosis. Skin warm and dry. Turgor intact.  PSYCHIATRIC: Mood, affect within normal limits. The patient is awake, alert and oriented x 3. Insight, judgment intact.       IMAGING    DG Chest 1 View  Result Date: 12/19/2019 CLINICAL DATA:  Fever, cough, COVID EXAM: CHEST  1 VIEW COMPARISON:  None. FINDINGS: Heart is normal size. Patchy bilateral airspace opacities. No effusions or pneumothorax. No acute bony abnormality. IMPRESSION: Patchy bilateral airspace disease compatible with COVID pneumonia. Electronically Signed   By: Charlett Nose M.D.   On: 12/19/2019 23:20   CT ANGIO CHEST PE W OR WO CONTRAST  Result Date: 12/25/2019 CLINICAL DATA:  Worsening hypoxia, COVID-19 positive EXAM: CT ANGIOGRAPHY CHEST WITH CONTRAST TECHNIQUE: Multidetector CT imaging of the chest was performed using the standard protocol during bolus administration of intravenous contrast. Multiplanar CT image reconstructions and MIPs were obtained to evaluate the vascular anatomy. CONTRAST:  27mL OMNIPAQUE IOHEXOL 350 MG/ML SOLN COMPARISON:  None. FINDINGS: Cardiovascular: Satisfactory opacification of the pulmonary arteries to the segmental level. No evidence of pulmonary embolism. Normal heart size. No pericardial effusion. Mediastinum/Nodes: No enlarged mediastinal, hilar, or axillary lymph nodes. Thyroid gland, trachea, and esophagus demonstrate no significant findings. Extensive pneumomediastinum dissecting into the neck, right worse than left. Lungs/Pleura: No pleural effusion or pneumothorax. Bilateral patchy ground-glass opacities and airspace disease throughout the lungs consistent with patient's known history of COVID-19 pneumonia. Upper Abdomen: No acute abnormality. Musculoskeletal: No chest wall abnormality. No acute or significant  osseous findings. Review of the MIP images confirms the above findings. IMPRESSION: 1. No evidence of pulmonary embolus. 2. Extensive pneumomediastinum dissecting into the neck, right worse than left. 3. Bilateral patchy ground-glass opacities and airspace disease throughout the lungs consistent with patient's known history of COVID-19 pneumonia. Electronically Signed   By: Elige Ko   On: 12/25/2019 15:04      ASSESSMENT/PLAN   Spontaneous pneumomediastinum - decrease shearing forces to minimize progression - will add scheduled Hycodan for now, will d/c vicodin to prevent overtreatment adverse effect such as respiratory depression   - supportive care only   - may pause bronchopulmonary hygiene such as vest therapy/incentive spirometery/ etc.   -  If progressive and severe will review with thoracic surgery    Acute COVID19 pneumonia -Remdesevir antiviral - pharmacy protocol 5 d -vitamin C -zinc -solumedrol - currently at 60 bid>>40 bid -Diuresis - Lasix 40 IV daily - monitor UOP - utilize external urinary catheter if possible -Self prone if patient can tolerate  -d/c hepatotoxic medications while on remdesevir -supportive care with ICU telemetry monitoring -PT/OT when possible -procalcitonin, CRP and ferritin trending     Thank you for allowing me to participate in the care of this patient.   Patient/Family are satisfied with care plan and all questions have been answered.   This document was prepared using Dragon voice recognition software and may include unintentional dictation errors.     Vida Rigger, M.D.  Division of Pulmonary & Critical Care Medicine  [facility]

## 2019-12-27 DIAGNOSIS — J9601 Acute respiratory failure with hypoxia: Secondary | ICD-10-CM

## 2019-12-27 DIAGNOSIS — J9600 Acute respiratory failure, unspecified whether with hypoxia or hypercapnia: Secondary | ICD-10-CM

## 2019-12-27 DIAGNOSIS — I10 Essential (primary) hypertension: Secondary | ICD-10-CM

## 2019-12-27 DIAGNOSIS — E119 Type 2 diabetes mellitus without complications: Secondary | ICD-10-CM

## 2019-12-27 LAB — CBC WITH DIFFERENTIAL/PLATELET
Abs Immature Granulocytes: 0.48 10*3/uL — ABNORMAL HIGH (ref 0.00–0.07)
Basophils Absolute: 0 10*3/uL (ref 0.0–0.1)
Basophils Relative: 0 %
Eosinophils Absolute: 0 10*3/uL (ref 0.0–0.5)
Eosinophils Relative: 0 %
HCT: 42.6 % (ref 39.0–52.0)
Hemoglobin: 14.4 g/dL (ref 13.0–17.0)
Immature Granulocytes: 5 %
Lymphocytes Relative: 5 %
Lymphs Abs: 0.6 10*3/uL — ABNORMAL LOW (ref 0.7–4.0)
MCH: 27.9 pg (ref 26.0–34.0)
MCHC: 33.8 g/dL (ref 30.0–36.0)
MCV: 82.4 fL (ref 80.0–100.0)
Monocytes Absolute: 0.3 10*3/uL (ref 0.1–1.0)
Monocytes Relative: 2 %
Neutro Abs: 9.4 10*3/uL — ABNORMAL HIGH (ref 1.7–7.7)
Neutrophils Relative %: 88 %
Platelets: 286 10*3/uL (ref 150–400)
RBC: 5.17 MIL/uL (ref 4.22–5.81)
RDW: 15.1 % (ref 11.5–15.5)
WBC: 10.7 10*3/uL — ABNORMAL HIGH (ref 4.0–10.5)
nRBC: 0 % (ref 0.0–0.2)

## 2019-12-27 LAB — GLUCOSE, CAPILLARY
Glucose-Capillary: 189 mg/dL — ABNORMAL HIGH (ref 70–99)
Glucose-Capillary: 251 mg/dL — ABNORMAL HIGH (ref 70–99)
Glucose-Capillary: 297 mg/dL — ABNORMAL HIGH (ref 70–99)
Glucose-Capillary: 259 mg/dL — ABNORMAL HIGH (ref 70–99)

## 2019-12-27 LAB — LACTATE DEHYDROGENASE: LDH: 694 U/L — ABNORMAL HIGH (ref 98–192)

## 2019-12-27 LAB — COMPREHENSIVE METABOLIC PANEL
ALT: 91 U/L — ABNORMAL HIGH (ref 0–44)
AST: 42 U/L — ABNORMAL HIGH (ref 15–41)
Albumin: 3.1 g/dL — ABNORMAL LOW (ref 3.5–5.0)
Alkaline Phosphatase: 58 U/L (ref 38–126)
Anion gap: 10 (ref 5–15)
BUN: 21 mg/dL — ABNORMAL HIGH (ref 6–20)
CO2: 26 mmol/L (ref 22–32)
Calcium: 8.5 mg/dL — ABNORMAL LOW (ref 8.9–10.3)
Chloride: 96 mmol/L — ABNORMAL LOW (ref 98–111)
Creatinine, Ser: 0.85 mg/dL (ref 0.61–1.24)
GFR calc Af Amer: >60 mL/min (ref >60)
GFR calc non Af Amer: >60 mL/min (ref >60)
Glucose, Bld: 215 mg/dL — ABNORMAL HIGH (ref 70–99)
Potassium: 4.5 mmol/L (ref 3.5–5.1)
Sodium: 132 mmol/L — ABNORMAL LOW (ref 135–145)
Total Bilirubin: 1.2 mg/dL (ref 0.3–1.2)
Total Protein: 6.6 g/dL (ref 6.5–8.1)

## 2019-12-27 LAB — FIBRIN DERIVATIVES D-DIMER (ARMC ONLY): Fibrin derivatives D-dimer (ARMC): 4909.14 ng/mL (FEU) — ABNORMAL HIGH (ref 0.00–499.00)

## 2019-12-27 LAB — C-REACTIVE PROTEIN: CRP: 9.6 mg/dL — ABNORMAL HIGH (ref <1.0)

## 2019-12-27 LAB — MAGNESIUM: Magnesium: 2.3 mg/dL (ref 1.7–2.4)

## 2019-12-27 LAB — FERRITIN: Ferritin: 1125 ng/mL — ABNORMAL HIGH (ref 24–336)

## 2019-12-27 LAB — PHOSPHORUS: Phosphorus: 3.7 mg/dL (ref 2.5–4.6)

## 2019-12-27 MED ORDER — DEXAMETHASONE SODIUM PHOSPHATE 10 MG/ML IJ SOLN
4.0000 mg | INTRAMUSCULAR | Status: DC
  Administered 2019-12-27 – 2019-12-28 (×2): 4 mg via INTRAVENOUS
  Filled 2019-12-27 (×2): qty 1

## 2019-12-27 NOTE — Progress Notes (Addendum)
Patient asked to wash up. Informed patient that it was not safe for him to take a full shower due to O2 requirements and needing to avoid adding extension tubing per RT. Offered to help him or at least get him set up for a bed bath, but patient declined. Messaged MD and RT to keep them informed as patient said "I may just do it on my own then." Educated patient on safety. Will attempt to offer bed bath again. Charge nurse aware. Dr. Joseph Art in agreement.

## 2019-12-27 NOTE — Progress Notes (Signed)
Pulmonary Medicine          Date: 12/27/2019,   MRN# [id number] [name] 03/04/1971     AdmissionWeight: (!) 142.8 kg                 CurrentWeight: 133.7 kg  Referring physician: Dr Allena KatzPatel    CHIEF COMPLAINT:   Acute hypoxemic respiratory failure due to COVID19   SUBJECTIVE     [name] is a [age] y.o. male with medical history significant for diabetes, hypertension and obesity, unvaccinated against Covid who presents to the emergency room with a several day history of fever cough and shortness of breath.  His wife has similar but milder symptoms.  Due to progressing symptoms came into the emergency room for evaluation.   On arrival he was febrile at 102.2 tachypneic at 22 with O2 sat 97% on room air but desaturating to 86 with ambulation.  BP 183/77.  Covid test positive, chest x-ray showed patchy bilateral airspace disease compatible with Covid pneumonia.  Patient started on usual treatment, oxygen.  Hospitalist consulted for admission.  Patient was then treated for hypertensive emergency. Patient had additional chest imaging with pneumomediastinum with pulmonary consultation placed for further evaluation and management.     12/26/19- patient is in no distress, we discussed pneumomediastinum. He is feels cough is improved with hycodan. Continue current care plan.   12/27/19- Patient is weaned to 6L/min, he enjoys higher setting and wants 13L/min.  12/27/19  PAST MEDICAL HISTORY   Past Medical History:  Diagnosis Date  . COVID-19 12/19/2019   [facility]  . Diabetes mellitus without complication (HCC)   . History of tobacco use   . Hyperlipidemia   . Hypertension   . Obesity      SURGICAL HISTORY   History reviewed. No pertinent surgical history.   FAMILY HISTORY   Family History  Problem Relation Age of Onset  . Hypertension Mother   . Diabetes Mother   . Diabetes Maternal Grandmother      SOCIAL HISTORY   Social  History   Tobacco Use  . Smoking status: Former Smoker    Types: Cigarettes  . Smokeless tobacco: Never Used  . Tobacco comment: occasional  Vaping Use  . Vaping Use: Never used  Substance Use Topics  . Alcohol use: No    Alcohol/week: 0.0 standard drinks  . Drug use: No     MEDICATIONS    Home Medication:    Current Medication:  Current Facility-Administered Medications:  .  acetaminophen (TYLENOL) tablet 650 mg, 650 mg, Oral, Q6H PRN, Andris Baumannuncan, Hazel V, MD, 650 mg at 12/20/19 2348 .  albuterol (VENTOLIN HFA) 108 (90 Base) MCG/ACT inhaler 2 puff, 2 puff, Inhalation, Q6H, Andris Baumannuncan, Hazel V, MD, 2 puff at 12/27/19 1427 .  amLODipine (NORVASC) tablet 5 mg, 5 mg, Oral, Daily, Rolly SalterPatel, Pranav M, MD, 5 mg at 12/27/19 0905 .  ascorbic acid (VITAMIN C) tablet 500 mg, 500 mg, Oral, Daily, Lindajo Royaluncan, Hazel V, MD, 500 mg at 12/27/19 0905 .  baricitinib (OLUMIANT) tablet 4 mg, 4 mg, Oral, Daily, Lindajo Royaluncan, Hazel V, MD, 4 mg at 12/27/19 0906 .  chlorhexidine (PERIDEX) 0.12 % solution 15 mL, 15 mL, Mouth/Throat, BID, Rolly SalterPatel, Pranav M, MD, 15 mL at 12/27/19 0907 .  chlorpheniramine-HYDROcodone (TUSSIONEX) 10-8 MG/5ML suspension 5 mL, 5 mL, Oral, Q12H PRN, Andris Baumannuncan, Hazel V, MD, 5 mL at 12/25/19 2000 .  cholecalciferol (VITAMIN D3) tablet 1,000 Units, 1,000 Units,  Oral, Daily, Rolly Salter, MD, 1,000 Units at 12/27/19 6720 .  furosemide (LASIX) injection 40 mg, 40 mg, Intravenous, Daily, Vida Rigger, MD, 40 mg at 12/27/19 0905 .  hydrALAZINE (APRESOLINE) tablet 25 mg, 25 mg, Oral, Q6H PRN, Rolly Salter, MD, 25 mg at 12/21/19 1739 .  spironolactone (ALDACTONE) tablet 25 mg, 25 mg, Oral, Daily, 25 mg at 12/27/19 0905 **AND** hydrochlorothiazide (HYDRODIURIL) tablet 25 mg, 25 mg, Oral, Daily, Rolly Salter, MD, 25 mg at 12/27/19 0905 .  HYDROcodone-homatropine (HYCODAN) 5-1.5 MG/5ML syrup 5 mL, 5 mL, Oral, Q4H, Roper Tolson, MD, 5 mL at 12/27/19 1740 .  insulin aspart (novoLOG) injection 0-20  Units, 0-20 Units, Subcutaneous, TID WC, Andris Baumann, MD, 11 Units at 12/27/19 1740 .  insulin aspart (novoLOG) injection 0-5 Units, 0-5 Units, Subcutaneous, QHS, Andris Baumann, MD, 2 Units at 12/22/19 2142 .  insulin aspart (novoLOG) injection 10 Units, 10 Units, Subcutaneous, TID WC, Rolly Salter, MD, 10 Units at 12/27/19 1741 .  insulin detemir (LEVEMIR) injection 26 Units, 26 Units, Subcutaneous, BID, Rolly Salter, MD, 26 Units at 12/27/19 0910 .  labetalol (NORMODYNE) injection 5 mg, 5 mg, Intravenous, Q2H PRN, Rolly Salter, MD, 5 mg at 12/21/19 1547 .  linagliptin (TRADJENTA) tablet 5 mg, 5 mg, Oral, Daily, Lindajo Royal V, MD, 5 mg at 12/27/19 0906 .  melatonin tablet 5 mg, 5 mg, Oral, QHS, Manuela Schwartz, NP, 5 mg at 12/26/19 2133 .  menthol-cetylpyridinium (CEPACOL) lozenge 3 mg, 1 lozenge, Oral, PRN, Rolly Salter, MD .  methylPREDNISolone sodium succinate (SOLU-MEDROL) 40 mg/mL injection 40 mg, 40 mg, Intravenous, BID, Vida Rigger, MD, 40 mg at 12/27/19 0905 .  multivitamin with minerals tablet 1 tablet, 1 tablet, Oral, Daily, Andris Baumann, MD, 1 tablet at 12/27/19 0907 .  nystatin (MYCOSTATIN) 100000 UNIT/ML suspension 500,000 Units, 5 mL, Oral, QID, Rolly Salter, MD, 500,000 Units at 12/27/19 1740 .  ondansetron (ZOFRAN) tablet 4 mg, 4 mg, Oral, Q6H PRN **OR** ondansetron (ZOFRAN) injection 4 mg, 4 mg, Intravenous, Q6H PRN, Andris Baumann, MD, 4 mg at 12/26/19 1020 .  oxymetazoline (AFRIN) 0.05 % nasal spray 1 spray, 1 spray, Each Nare, BID, Rolly Salter, MD, 1 spray at 12/27/19 0911 .  phenol (CHLORASEPTIC) mouth spray 1 spray, 1 spray, Mouth/Throat, PRN, Rolly Salter, MD, 1 spray at 12/23/19 1104 .  protein supplement (ENSURE MAX) liquid, 11 oz, Oral, TID, Rolly Salter, MD, 11 oz at 12/27/19 0918 .  sodium chloride (OCEAN) 0.65 % nasal spray 1 spray, 1 spray, Each Nare, PRN, Rolly Salter, MD .  sodium chloride flush (NS) 0.9 % injection 10 mL,  10 mL, Intravenous, Q12H, Rolly Salter, MD, 10 mL at 12/27/19 0911 .  zinc sulfate capsule 220 mg, 220 mg, Oral, Daily, Lindajo Royal V, MD, 220 mg at 12/27/19 9470    ALLERGIES   Patient has no known allergies.     REVIEW OF SYSTEMS    Review of Systems:  Gen:  Denies  fever, sweats, chills weigh loss  HEENT: Denies blurred vision, double vision, ear pain, eye pain, hearing loss, nose bleeds, sore throat Cardiac:  No dizziness, chest pain or heaviness, chest tightness,edema Resp:   Cough, dyspnea+ Gi: Denies swallowing difficulty, stomach pain, nausea or vomiting, diarrhea, constipation, bowel incontinence Gu:  Denies bladder incontinence, burning urine Ext:   Denies Joint pain, stiffness or swelling Skin: Denies  skin rash, easy bruising or bleeding  or hives Endoc:  Denies polyuria, polydipsia , polyphagia or weight change Psych:   Denies depression, insomnia or hallucinations   Other:  All other systems negative   VS: BP 138/84 (BP Location: Right Arm)   Pulse 99   Temp 97.8 F (36.6 C)   Resp 19   Ht 6' (1.829 m)   Wt 133.7 kg   SpO2 98%   BMI 39.98 kg/m      PHYSICAL EXAM    GENERAL:NAD, no fevers, chills, no weakness no fatigue HEAD: Normocephalic, atraumatic.  EYES: Pupils equal, round, reactive to light. Extraocular muscles intact. No scleral icterus.  MOUTH: Moist mucosal membrane. Dentition intact. No abscess noted.  EAR, NOSE, THROAT: Clear without exudates. No external lesions.  NECK: Supple. No thyromegaly. No nodules. No JVD.  PULMONARY:bilateral rhonchi, difficult to appreciate auscultaion due to negativepressure fans CARDIOVASCULAR: S1 and S2. Regular rate and rhythm. No murmurs, rubs, or gallops. No edema. Pedal pulses 2+ bilaterally.  GASTROINTESTINAL: Soft, nontender, nondistended. No masses. Positive bowel sounds. No hepatosplenomegaly.  MUSCULOSKELETAL: No swelling, clubbing, or edema. Range of motion full in all extremities.    NEUROLOGIC: Cranial nerves II through XII are intact. No gross focal neurological deficits. Sensation intact. Reflexes intact.  SKIN: No ulceration, lesions, rashes, or cyanosis. Skin warm and dry. Turgor intact.  PSYCHIATRIC: Mood, affect within normal limits. The patient is awake, alert and oriented x 3. Insight, judgment intact.       IMAGING    DG Chest 1 View  Result Date: 12/19/2019 CLINICAL DATA:  Fever, cough, COVID EXAM: CHEST  1 VIEW COMPARISON:  None. FINDINGS: Heart is normal size. Patchy bilateral airspace opacities. No effusions or pneumothorax. No acute bony abnormality. IMPRESSION: Patchy bilateral airspace disease compatible with COVID pneumonia. Electronically Signed   By: Charlett Nose M.D.   On: 12/19/2019 23:20   CT ANGIO CHEST PE W OR WO CONTRAST  Result Date: 12/25/2019 CLINICAL DATA:  Worsening hypoxia, COVID-19 positive EXAM: CT ANGIOGRAPHY CHEST WITH CONTRAST TECHNIQUE: Multidetector CT imaging of the chest was performed using the standard protocol during bolus administration of intravenous contrast. Multiplanar CT image reconstructions and MIPs were obtained to evaluate the vascular anatomy. CONTRAST:  49mL OMNIPAQUE IOHEXOL 350 MG/ML SOLN COMPARISON:  None. FINDINGS: Cardiovascular: Satisfactory opacification of the pulmonary arteries to the segmental level. No evidence of pulmonary embolism. Normal heart size. No pericardial effusion. Mediastinum/Nodes: No enlarged mediastinal, hilar, or axillary lymph nodes. Thyroid gland, trachea, and esophagus demonstrate no significant findings. Extensive pneumomediastinum dissecting into the neck, right worse than left. Lungs/Pleura: No pleural effusion or pneumothorax. Bilateral patchy ground-glass opacities and airspace disease throughout the lungs consistent with patient's known history of COVID-19 pneumonia. Upper Abdomen: No acute abnormality. Musculoskeletal: No chest wall abnormality. No acute or significant osseous findings.  Review of the MIP images confirms the above findings. IMPRESSION: 1. No evidence of pulmonary embolus. 2. Extensive pneumomediastinum dissecting into the neck, right worse than left. 3. Bilateral patchy ground-glass opacities and airspace disease throughout the lungs consistent with patient's known history of COVID-19 pneumonia. Electronically Signed   By: Elige Ko   On: 12/25/2019 15:04      ASSESSMENT/PLAN   Spontaneous pneumomediastinum - decrease shearing forces to minimize progression - will add scheduled Hycodan for now, will d/c vicodin to prevent overtreatment adverse effect such as respiratory depression   - supportive care only   - may pause bronchopulmonary hygiene such as vest therapy/incentive spirometery/ etc.   - If progressive and severe  will review with thoracic surgery    Acute COVID19 pneumonia -Remdesevir antiviral - pharmacy protocol 5 d -vitamin C -zinc -solumedrol - currently at 60 bid>>40 bid -Diuresis - Lasix 40 IV daily - monitor UOP - utilize external urinary catheter if possible -Self prone if patient can tolerate  -d/c hepatotoxic medications while on remdesevir -supportive care with ICU telemetry monitoring -PT/OT when possible -procalcitonin, CRP and ferritin trending   -patient has not had bowel movement in 7 days, declines bowel regimen   Thank you for allowing me to participate in the care of this patient.   Patient/Family are satisfied with care plan and all questions have been answered.   This document was prepared using Dragon voice recognition software and may include unintentional dictation errors.     Vida Rigger, M.D.  Division of Pulmonary & Critical Care Medicine  [facility]

## 2019-12-27 NOTE — Progress Notes (Signed)
Patient states dining is not letting him order his food. Per dietary services, patient was asking for food above his carb count. Discussed situation with patient. He states his tray was wrong and he would like a completely new tray. He requested that the order matched his lunch order exactly and he read me the ticket. I called dining and gave them permission to send a new tray as patient states he will not eat the current tray because it is cold. Educated patient on why he has a carb limit and that his blood sugars have been elevated so we need to adhere to the restriction. He is agreeable.

## 2019-12-27 NOTE — Progress Notes (Signed)
Inpatient Diabetes Program Recommendations  AACE/ADA: New Consensus Statement on Inpatient Glycemic Control   Target Ranges:  Prepandial:   less than 140 mg/dL      Peak postprandial:   less than 180 mg/dL (1-2 hours)      Critically ill patients:  140 - 180 mg/dL   Results for [name], [name] (MRN [id number]) as of 12/27/2019 13:26  Ref. Range 12/26/2019 08:13 12/26/2019 16:52 12/26/2019 20:13 12/27/2019 08:38 12/27/2019 11:24  Glucose-Capillary Latest Ref Range: 70 - 99 mg/dL 149 (H) 702 (H) 637 (H) 189 (H) 251 (H)   Results for [name], [name] (MRN [id number]) as of 12/27/2019 13:26  Ref. Range 04/12/2015 09:31 08/07/2016 09:43 12/20/2019 11:00  Hemoglobin A1C Latest Ref Range: 4.8 - 5.6 % 8.2 9.0 10.4 (H)   Review of Glycemic Control  Diabetes history: DM2 Outpatient Diabetes medications: None Current orders for Inpatient glycemic control: Lantus 26 units BID, Novolog 10 units TID with meals, Novolog 0-20 units TID with meals, Novolog 0-5 units QHS, Tradjenta 5 mg daily; Solumedrol 40 mg BID  NOTE: Spoke with patient over the phone about diabetes. Patient reports that he has a history of DM and has seen a provider in the past for DM. Patient states that he use to take oral DM medication but it has "been awhile" since he seen a provider. Patient reports that he plans to get reestablished with [facility] for primary care.  Patient does not know what an A1C is when asked.  Explained what an A1C is and discussed A1C results (10.4% on 12/20/19) and explained that current A1C indicates an average glucose of 252 mg/dl over the past 2-3 months. Discussed glucose and A1C goals. Asked patient if he has received a Living Well with DM book and patient is not sure if he has or not. Patient reports that he has a lot going on and is still not feeling well. He states that he is having a hard time comprehending everything going on with him and everything he is being told about over since admitted.   Provided emotional support and informed patient that I would ask nursing to see if he has the book in his room and if so, nursing would be asked to work with him during each interaction to provide diabetes education as well as insulin education in case he is discharged on insulin. Encouraged patient to read the Living Well with DM book when he felt up to it and informed patient that diabetes coordinator would call him again to follow up when he is closer to being discharged.  Patient verbalized understanding of information discussed and reports no further questions at this time related to diabetes. Sent communication to [employer], RN to ask to see if the book was in his room and to ask that nursing work with patient regarding DM and insulin with each interaction.   Thanks, Orlando Penner, RN, MSN, CDE Diabetes Coordinator Inpatient Diabetes Program [phone] (Team Pager)

## 2019-12-27 NOTE — Progress Notes (Signed)
PROGRESS NOTE    [name]  IBB:[id number] DOB: [birth year] DOA: 12/19/2019 PCP: Patient, No Pcp Per     Brief Narrative:  Past medical history of morbid obesity. Patient does not take any medications for diabetes or hypertension. Presents with shortness of breath and found to have COVID-19 pneumonia as well as hypertensive urgency. Currently plan is continue current care for hypoxia.   Subjective: A/O x4, positive S OB, positive DOE negative CP, negative abdominal pain   Assessment & Plan: Covid vaccination;   Principal Problem:   Pneumonia due to COVID-19 virus Active Problems:   Hypertension   Diabetes mellitus without complication (HCC)   Acute respiratory failure due to COVID-19 (HCC)   Obesity, Class III, BMI 40-49.9 (morbid obesity) (HCC)   Acute respiratory failure with hypoxia/Covid pneumonia COVID-19 Labs  Recent Labs    12/25/19 0611 12/26/19 1017  FERRITIN 1,244*  --   CRP 2.4* 5.0*    Lab Results  Component Value Date   SARSCOV2NAA POSITIVE (A) 12/19/2019     Pneumoperitoneum.  CXR: hazy bilateral peripheral opacities CT PE protocol on 12/25/2019 shows extensive pneumoperitoneum along with extensive groundglass opacity. Negative for PE. Pulmonary consulted for pneumoperitoneum.  Currently conservative measures with close monitoring.  No incentive spirometry.  Oxygen requirements Currently on 15 L of oxygen and NRB CRP: 13.5 to 1.3, now again trending to 5.0. Remdesivir: 5-day course completed on 12/24/2019. Steroids: Started on 12/19/2019 with Decadron. Currently on Solu-Medrol 60 mg twice daily.  Baricitinib/Actemra(off-label use):Also started on 12/19/2019.  The investigational nature of this medication was discussed with the patient/HCPOA and they choose to proceed as the potential benefits are felt to outweigh risks at this time.   Antibiotics:none Vitamin C and Zinc: continue DVT Prophylaxis:enoxaparin (LOVENOX) injection 40  mg Start: 12/20/19 0215 Prone positioning: Patient encouraged to stay in prone position as much as possible.  The treatment plan and use of medications and known side effects were discussed with patient/family. It was clearly explained that Complete risks and long-term side effects are unknown, however in the best clinical judgment they seem to be of some clinical benefit rather than medical risks. Patient/family agree with the treatment plan and want to receive these treatments as indicated.  2. Hypertensive urgency Blood pressure significantly elevated on admission, Beyond 200 systolic. Now better. Continue current regimen of clonidine, Norvasc, HCTZ and Aldactone as well as as needed hydralazine. Monitor electrolytes.  3. Hypokalemia Corrected. Monitor.  4. Type 2 diabetes mellitus, uncontrolled with hyperglycemia without complication Blood Sugar significantly elevated. A1c 10.5. Continue current regimen for now. Patient has been informed that he will be discharged with insulin given his high A1c.  5. Elevated LFT In the setting of COVID-19. Monitor.  6. Leukopenia In the setting of COVID-19. Monitor.  7. Obesity, Putting the patient at high risk for worsening respiratory illness from COVID-19. Patient reports having a negative sleep study 2 years ago at home. Body mass index is 40.23 kg/m.  Monitor.  8. Oral ulcer Patient has seen urgent care for the same. Continuing chlorhexidine and Magic mouthwash with lidocaine. Anticipating poor wound healing in the setting of patient being on steroid as well as baricitinib Discussed with ENT on-call, Outpatient ENT follow-up recommended.  9. Elevated D-dimer. D-dimer is trending upwards despite improvement in other inflammatory markers. At present patient does not have any new hypotension or worsening hypoxia or tachycardia. CT PE protocol is also negative for pulmonary bleeding.  10.Oral thrush. Nystatin  Added.  11.Epistaxis. Afrin  spray scheduled. Also Ocean Spray. Currently resolved.   DVT prophylaxis: Lovenox Code Status: Full Family Communication:  Status is: Inpatient    Dispo: The patient is from: Home              Anticipated d/c is to: Home              Anticipated d/c date is:??              Patient currently extremely unstable      Consultants:  PCCM  Procedures/Significant Events:  8/30 CTA chest PE protocol;No evidence of pulmonary embolus. 2. Extensive pneumomediastinum dissecting into the neck, right worse than left. 3. Bilateral patchy ground-glass opacities and airspace disease throughout the lungs consistent with patient's known history of COVID-19 pneumonia.  I have personally reviewed and interpreted all radiology studies and my findings are as above.  VENTILATOR SETTINGS: HFNC 9/1 Flow rate; 15 L/min SPO2; 95%   Cultures   Antimicrobials: Anti-infectives (From admission, onward)   Start     Ordered Stop   12/21/19 1000  remdesivir 100 mg in sodium chloride 0.9 % 100 mL IVPB       "Followed by" Linked Group Details   12/20/19 0051 12/24/19 1038   12/21/19 1000  remdesivir 100 mg in sodium chloride 0.9 % 100 mL IVPB  Status:  Discontinued       "Followed by" Linked Group Details   12/20/19 0157 12/20/19 0201   12/20/19 0200  remdesivir 200 mg in sodium chloride 0.9% 250 mL IVPB  Status:  Discontinued       "Followed by" Linked Group Details   12/20/19 0157 12/20/19 0201   12/20/19 0100  remdesivir 200 mg in sodium chloride 0.9% 250 mL IVPB       "Followed by" Linked Group Details   12/20/19 0051 12/20/19 0320       Devices    LINES / TUBES:      Continuous Infusions:   Objective: Vitals:   12/27/19 0502 12/27/19 0505 12/27/19 0839 12/27/19 0845  BP: (!) 154/82  (!) 153/84   Pulse: 82  80   Resp: 18  19   Temp: 98.2 F (36.8 C)  98 F (36.7 C)   TempSrc: Oral  Oral   SpO2: 96%  93% 95%  Weight:  133.7 kg     Height:        Intake/Output Summary (Last 24 hours) at 12/27/2019 0931 Last data filed at 12/27/2019 9326 Gross per 24 hour  Intake --  Output 0 ml  Net 0 ml   Filed Weights   12/25/19 0314 12/26/19 0500 12/27/19 0505  Weight: (!) 137.8 kg 134.5 kg 133.7 kg    Examination:  General: A/O x4, positive acute respiratory distress Eyes: negative scleral hemorrhage, negative anisocoria, negative icterus ENT: Negative Runny nose, negative gingival bleeding, Neck:  Negative scars, masses, torticollis, lymphadenopathy, JVD Lungs: extremely poor breath sounds bilaterally without wheezes or crackles Cardiovascular: Regular rate and rhythm without murmur gallop or rub normal S1 and S2 Abdomen: negative abdominal pain, nondistended, positive soft, bowel sounds, no rebound, no ascites, no appreciable mass Extremities: No significant cyanosis, clubbing, or edema bilateral lower extremities Skin: Negative rashes, lesions, ulcers Psychiatric:  Negative depression, negative anxiety, negative fatigue, negative mania  Central nervous system:  Cranial nerves II through XII intact, tongue/uvula midline, all extremities muscle strength 5/5, sensation intact throughout, negative dysarthria, negative expressive aphasia, negative receptive aphasia.  .     Data Reviewed: Care  during the described time interval was provided by me .  I have reviewed this patient's available data, including medical history, events of note, physical examination, and all test results as part of my evaluation.  CBC: Recent Labs  Lab 12/23/19 0530 12/24/19 0628 12/25/19 0611 12/26/19 1017 12/27/19 0624  WBC 11.9* 13.1* 13.5* 10.5 10.7*  NEUTROABS 10.0* 10.6* 11.5* 9.2* 9.4*  HGB 14.7 15.4 15.8 15.2 14.4  HCT 43.3 46.2 46.6 45.3 42.6  MCV 82.3 81.9 82.0 82.1 82.4  PLT 228 270 305 310 286   Basic Metabolic Panel: Recent Labs  Lab 12/23/19 0530 12/24/19 0628 12/25/19 0611 12/26/19 1017 12/27/19 0624  NA 135 134*  135 133* 132*  K 4.0 3.8 4.0 4.2 4.5  CL 99 96* 96* 95* 96*  CO2 27 26 28 28 26   GLUCOSE 257* 240* 185* 226* 215*  BUN 24* 26* 20 27* 21*  CREATININE 0.96 0.85 0.99 1.00 0.85  CALCIUM 8.4* 8.4* 8.6* 8.5* 8.5*  MG 2.3 2.5* 2.2 2.3 2.3   GFR: Estimated Creatinine Clearance: 152 mL/min (by C-G formula based on SCr of 0.85 mg/dL). Liver Function Tests: Recent Labs  Lab 12/23/19 0530 12/24/19 0628 12/25/19 0611 12/26/19 1017 12/27/19 0624  AST 103* 71* 73* 53* 42*  ALT 102* 102* 109* 103* 91*  ALKPHOS 86 91 90 71 58  BILITOT 0.9 1.0 1.2 1.0 1.2  PROT 6.6 6.7 7.3 6.9 6.6  ALBUMIN 3.2* 3.3* 3.4* 3.2* 3.1*   No results for input(s): LIPASE, AMYLASE in the last 168 hours. No results for input(s): AMMONIA in the last 168 hours. Coagulation Profile: No results for input(s): INR, PROTIME in the last 168 hours. Cardiac Enzymes: No results for input(s): CKTOTAL, CKMB, CKMBINDEX, TROPONINI in the last 168 hours. BNP (last 3 results) No results for input(s): PROBNP in the last 8760 hours. HbA1C: No results for input(s): HGBA1C in the last 72 hours. CBG: Recent Labs  Lab 12/25/19 2144 12/26/19 0813 12/26/19 1652 12/26/19 2013 12/27/19 0838  GLUCAP 126* 203* 214* 146* 189*   Lipid Profile: No results for input(s): CHOL, HDL, LDLCALC, TRIG, CHOLHDL, LDLDIRECT in the last 72 hours. Thyroid Function Tests: No results for input(s): TSH, T4TOTAL, FREET4, T3FREE, THYROIDAB in the last 72 hours. Anemia Panel: Recent Labs    12/25/19 0611  FERRITIN 1,244*   Sepsis Labs: No results for input(s): PROCALCITON, LATICACIDVEN in the last 168 hours.  Recent Results (from the past 240 hour(s))  SARS Coronavirus 2 by RT PCR (hospital order, performed in [facility] hospital lab) Nasopharyngeal Nasopharyngeal Swab     Status: Abnormal   Collection Time: 12/19/19  7:56 PM   Specimen: Nasopharyngeal Swab  Result Value Ref Range Status   SARS Coronavirus 2 POSITIVE (A) NEGATIVE Final     Comment: CRITICAL RESULT CALLED TO, READ BACK BY AND VERIFIED WITH: ASHLEY ORSUTO @ 2127 12/19/2019 RH (NOTE) SARS-CoV-2 target nucleic acids are DETECTED  SARS-CoV-2 RNA is generally detectable in upper respiratory specimens  during the acute phase of infection.  Positive results are indicative  of the presence of the identified virus, but do not rule out bacterial infection or co-infection with other pathogens not detected by the test.  Clinical correlation with patient history and  other diagnostic information is necessary to determine patient infection status.  The expected result is negative.  Fact Sheet for Patients:   12/21/2019   Fact Sheet for Healthcare Providers:   BoilerBrush.com.cy    This test is not yet approved or  cleared by the [place] FDA and  has been authorized for detection and/or diagnosis of SARS-CoV-2 by FDA under an Emergency Use Authorization (EUA).  This EUA will remain in effect (meaning  this test can be used) for the duration of  the COVID-19 declaration under Section 564(b)(1) of the Act, 21 U.S.C. section 360-bbb-3(b)(1), unless the authorization is terminated or revoked sooner.  Performed at [facility], [address] [place], [place] [address]   MRSA PCR Screening     Status: None   Collection Time: 12/20/19  8:46 PM   Specimen: Nasal Mucosa; Nasopharyngeal  Result Value Ref Range Status   MRSA by PCR NEGATIVE NEGATIVE Final    Comment:        The GeneXpert MRSA Assay (FDA approved for NASAL specimens only), is one component of a comprehensive MRSA colonization surveillance program. It is not intended to diagnose MRSA infection nor to guide or monitor treatment for MRSA infections. Performed at [facility], [address] [place], [place] [address]          Radiology Studies: CT ANGIO CHEST PE W OR WO CONTRAST  Result Date: 12/25/2019 CLINICAL  DATA:  Worsening hypoxia, COVID-19 positive EXAM: CT ANGIOGRAPHY CHEST WITH CONTRAST TECHNIQUE: Multidetector CT imaging of the chest was performed using the standard protocol during bolus administration of intravenous contrast. Multiplanar CT image reconstructions and MIPs were obtained to evaluate the vascular anatomy. CONTRAST:  75mL OMNIPAQUE IOHEXOL 350 MG/ML SOLN COMPARISON:  None. FINDINGS: Cardiovascular: Satisfactory opacification of the pulmonary arteries to the segmental level. No evidence of pulmonary embolism. Normal heart size. No pericardial effusion. Mediastinum/Nodes: No enlarged mediastinal, hilar, or axillary lymph nodes. Thyroid gland, trachea, and esophagus demonstrate no significant findings. Extensive pneumomediastinum dissecting into the neck, right worse than left. Lungs/Pleura: No pleural effusion or pneumothorax. Bilateral patchy ground-glass opacities and airspace disease throughout the lungs consistent with patient's known history of COVID-19 pneumonia. Upper Abdomen: No acute abnormality. Musculoskeletal: No chest wall abnormality. No acute or significant osseous findings. Review of the MIP images confirms the above findings. IMPRESSION: 1. No evidence of pulmonary embolus. 2. Extensive pneumomediastinum dissecting into the neck, right worse than left. 3. Bilateral patchy ground-glass opacities and airspace disease throughout the lungs consistent with patient's known history of COVID-19 pneumonia. Electronically Signed   By: Elige KoHetal  Patel   On: 12/25/2019 15:04        Scheduled Meds: . albuterol  2 puff Inhalation Q6H  . amLODipine  5 mg Oral Daily  . vitamin C  500 mg Oral Daily  . baricitinib  4 mg Oral Daily  . chlorhexidine  15 mL Mouth/Throat BID  . cholecalciferol  1,000 Units Oral Daily  . enoxaparin (LOVENOX) injection  40 mg Subcutaneous BID  . furosemide  40 mg Intravenous Daily  . spironolactone  25 mg Oral Daily   And  . hydrochlorothiazide  25 mg Oral Daily   . HYDROcodone-homatropine  5 mL Oral Q4H  . insulin aspart  0-20 Units Subcutaneous TID WC  . insulin aspart  0-5 Units Subcutaneous QHS  . insulin aspart  10 Units Subcutaneous TID WC  . insulin detemir  26 Units Subcutaneous BID  . linagliptin  5 mg Oral Daily  . melatonin  5 mg Oral QHS  . methylPREDNISolone (SOLU-MEDROL) injection  40 mg Intravenous BID  . multivitamin with minerals  1 tablet Oral Daily  . nystatin  5 mL Oral QID  . oxymetazoline  1 spray Each Nare  BID  . Ensure Max Protein  11 oz Oral TID  . sodium chloride flush  10 mL Intravenous Q12H  . zinc sulfate  220 mg Oral Daily   Continuous Infusions:   LOS: 7 days    Time spent:40 min    Ronin Crager, Roselind Messier, MD [facility] Pager [phone]  If 7PM-7AM, please contact night-coverage [email or web address] Password [facility] 12/27/2019, 9:31 AM

## 2019-12-28 DIAGNOSIS — Z6839 Body mass index (BMI) 39.0-39.9, adult: Secondary | ICD-10-CM

## 2019-12-28 LAB — COMPREHENSIVE METABOLIC PANEL
ALT: 80 U/L — ABNORMAL HIGH (ref 0–44)
AST: 30 U/L (ref 15–41)
Albumin: 3 g/dL — ABNORMAL LOW (ref 3.5–5.0)
Alkaline Phosphatase: 67 U/L (ref 38–126)
Anion gap: 10 (ref 5–15)
BUN: 27 mg/dL — ABNORMAL HIGH (ref 6–20)
CO2: 26 mmol/L (ref 22–32)
Calcium: 8.6 mg/dL — ABNORMAL LOW (ref 8.9–10.3)
Chloride: 94 mmol/L — ABNORMAL LOW (ref 98–111)
Creatinine, Ser: 1.12 mg/dL (ref 0.61–1.24)
GFR calc Af Amer: >60 mL/min (ref >60)
GFR calc non Af Amer: >60 mL/min (ref >60)
Glucose, Bld: 301 mg/dL — ABNORMAL HIGH (ref 70–99)
Potassium: 4.2 mmol/L (ref 3.5–5.1)
Sodium: 130 mmol/L — ABNORMAL LOW (ref 135–145)
Total Bilirubin: 0.9 mg/dL (ref 0.3–1.2)
Total Protein: 6.9 g/dL (ref 6.5–8.1)

## 2019-12-28 LAB — CBC WITH DIFFERENTIAL/PLATELET
Abs Immature Granulocytes: 0.53 10*3/uL — ABNORMAL HIGH (ref 0.00–0.07)
Basophils Absolute: 0 10*3/uL (ref 0.0–0.1)
Basophils Relative: 0 %
Eosinophils Absolute: 0 10*3/uL (ref 0.0–0.5)
Eosinophils Relative: 0 %
HCT: 41.5 % (ref 39.0–52.0)
Hemoglobin: 14.2 g/dL (ref 13.0–17.0)
Immature Granulocytes: 4 %
Lymphocytes Relative: 5 %
Lymphs Abs: 0.8 10*3/uL (ref 0.7–4.0)
MCH: 27.3 pg (ref 26.0–34.0)
MCHC: 34.2 g/dL (ref 30.0–36.0)
MCV: 79.7 fL — ABNORMAL LOW (ref 80.0–100.0)
Monocytes Absolute: 0.4 10*3/uL (ref 0.1–1.0)
Monocytes Relative: 3 %
Neutro Abs: 12.7 10*3/uL — ABNORMAL HIGH (ref 1.7–7.7)
Neutrophils Relative %: 88 %
Platelets: 334 10*3/uL (ref 150–400)
RBC: 5.21 MIL/uL (ref 4.22–5.81)
RDW: 14.9 % (ref 11.5–15.5)
WBC: 14.5 10*3/uL — ABNORMAL HIGH (ref 4.0–10.5)
nRBC: 0 % (ref 0.0–0.2)

## 2019-12-28 LAB — MAGNESIUM: Magnesium: 2.3 mg/dL (ref 1.7–2.4)

## 2019-12-28 LAB — C-REACTIVE PROTEIN: CRP: 5.7 mg/dL — ABNORMAL HIGH (ref <1.0)

## 2019-12-28 LAB — FIBRIN DERIVATIVES D-DIMER (ARMC ONLY): Fibrin derivatives D-dimer (ARMC): 3560.92 ng/mL (FEU) — ABNORMAL HIGH (ref 0.00–499.00)

## 2019-12-28 LAB — PHOSPHORUS: Phosphorus: 3.4 mg/dL (ref 2.5–4.6)

## 2019-12-28 LAB — LACTATE DEHYDROGENASE: LDH: 575 U/L — ABNORMAL HIGH (ref 98–192)

## 2019-12-28 LAB — FERRITIN: Ferritin: 1151 ng/mL — ABNORMAL HIGH (ref 24–336)

## 2019-12-28 LAB — GLUCOSE, CAPILLARY
Glucose-Capillary: 276 mg/dL — ABNORMAL HIGH (ref 70–99)
Glucose-Capillary: 270 mg/dL — ABNORMAL HIGH (ref 70–99)
Glucose-Capillary: 164 mg/dL — ABNORMAL HIGH (ref 70–99)
Glucose-Capillary: 110 mg/dL — ABNORMAL HIGH (ref 70–99)

## 2019-12-28 MED ORDER — AMLODIPINE BESYLATE 10 MG PO TABS
10.0000 mg | ORAL_TABLET | Freq: Every day | ORAL | Status: DC
  Administered 2019-12-28 – 2020-01-03 (×7): 10 mg via ORAL
  Filled 2019-12-28 (×7): qty 1

## 2019-12-28 NOTE — Progress Notes (Signed)
Inpatient Diabetes Program Recommendations  AACE/ADA: New Consensus Statement on Inpatient Glycemic Control  Target Ranges:  Prepandial:   less than 140 mg/dL      Peak postprandial:   less than 180 mg/dL (1-2 hours)      Critically ill patients:  140 - 180 mg/dL   Results for COLDEN, SAMARAS (MRN [id number]) as of 12/28/2019 08:11  Ref. Range 12/27/2019 08:38 12/27/2019 11:24 12/27/2019 16:19 12/27/2019 19:56 12/28/2019 07:35  Glucose-Capillary Latest Ref Range: 70 - 99 mg/dL 509 (H) 326 (H) 712 (H) 259 (H) 276 (H)   Review of Glycemic Control  Diabetes history: DM2 Outpatient Diabetes medications: None Current orders for Inpatient glycemic control: Levemir 26 units BID, Novolog 10 units TID with meals, Novolog 0-20 units TID with meals, Novolog 0-5 units QHS, Tradjenta 5 mg daily; Decadron 4 mg Q24H  Inpatient Diabetes Program Recommendations:    Insulin-If steroids are continued as currently ordered, please consider increasing Levemir to 30 units BID and meal coverage to Novolog 14 units TID with meals.  Thanks, Orlando Penner, RN, MSN, CDE Diabetes Coordinator Inpatient Diabetes Program [phone] (Team Pager from 8am to 5pm)

## 2019-12-28 NOTE — Progress Notes (Signed)
Pulmonary Medicine          Date: 12/28/2019,   MRN# [id number] [name] 1970/12/11     AdmissionWeight: (!) 142.8 kg                 CurrentWeight: 133.7 kg  Referring physician: Dr Allena KatzPatel    CHIEF COMPLAINT:   Acute hypoxemic respiratory failure due to COVID19   SUBJECTIVE     [name] is a 49 y.o. male with medical history significant for diabetes, hypertension and obesity, unvaccinated against Covid who presents to the emergency room with a several day history of fever cough and shortness of breath.  His wife has similar but milder symptoms.  Due to progressing symptoms came into the emergency room for evaluation.   On arrival he was febrile at 102.2 tachypneic at 22 with O2 sat 97% on room air but desaturating to 86 with ambulation.  BP 183/77.  Covid test positive, chest x-ray showed patchy bilateral airspace disease compatible with Covid pneumonia.  Patient started on usual treatment, oxygen.  Hospitalist consulted for admission.  Patient was then treated for hypertensive emergency. Patient had additional chest imaging with pneumomediastinum with pulmonary consultation placed for further evaluation and management.     12/26/19- patient is in no distress, we discussed pneumomediastinum. He is feels cough is improved with hycodan. Continue current care plan.   12/27/19- Patient is weaned to 6L/min, he enjoys higher setting and wants 13L/min. 12/28/19- patient continues to use unnecessarily high oxygen therapy. He is 100% saturation. Please wean with goal sPO2 88-92.  9/ 12/27/19  PAST MEDICAL HISTORY   Past Medical History:  Diagnosis Date  . COVID-19 12/19/2019   [facility]  . Diabetes mellitus without complication (HCC)   . History of tobacco use   . Hyperlipidemia   . Hypertension   . Obesity      SURGICAL HISTORY   History reviewed. No pertinent surgical history.   FAMILY HISTORY   Family History  Problem Relation  Age of Onset  . Hypertension Mother   . Diabetes Mother   . Diabetes Maternal Grandmother      SOCIAL HISTORY   Social History   Tobacco Use  . Smoking status: Former Smoker    Types: Cigarettes  . Smokeless tobacco: Never Used  . Tobacco comment: occasional  Vaping Use  . Vaping Use: Never used  Substance Use Topics  . Alcohol use: No    Alcohol/week: 0.0 standard drinks  . Drug use: No     MEDICATIONS    Home Medication:    Current Medication:  Current Facility-Administered Medications:  .  acetaminophen (TYLENOL) tablet 650 mg, 650 mg, Oral, Q6H PRN, Andris Baumannuncan, Hazel V, MD, 650 mg at 12/20/19 2348 .  albuterol (VENTOLIN HFA) 108 (90 Base) MCG/ACT inhaler 2 puff, 2 puff, Inhalation, Q6H, Andris Baumannuncan, Hazel V, MD, 2 puff at 12/28/19 1449 .  amLODipine (NORVASC) tablet 10 mg, 10 mg, Oral, Daily, Drema DallasWoods, Curtis J, MD, 10 mg at 12/28/19 0918 .  ascorbic acid (VITAMIN C) tablet 500 mg, 500 mg, Oral, Daily, Lindajo Royaluncan, Hazel V, MD, 500 mg at 12/28/19 0858 .  baricitinib (OLUMIANT) tablet 4 mg, 4 mg, Oral, Daily, Lindajo Royaluncan, Hazel V, MD, 4 mg at 12/28/19 0909 .  chlorhexidine (PERIDEX) 0.12 % solution 15 mL, 15 mL, Mouth/Throat, BID, Rolly SalterPatel, Pranav M, MD, 15 mL at 12/28/19 0855 .  chlorpheniramine-HYDROcodone (TUSSIONEX) 10-8 MG/5ML suspension 5 mL, 5 mL, Oral, Q12H  PRN, Andris Baumann, MD, 5 mL at 12/25/19 2000 .  cholecalciferol (VITAMIN D3) tablet 1,000 Units, 1,000 Units, Oral, Daily, Rolly Salter, MD, 1,000 Units at 12/28/19 0900 .  dexamethasone (DECADRON) injection 4 mg, 4 mg, Intravenous, Q24H, Macyn Remmert, MD, 4 mg at 12/27/19 2027 .  furosemide (LASIX) injection 40 mg, 40 mg, Intravenous, Daily, Vida Rigger, MD, 40 mg at 12/28/19 0855 .  hydrALAZINE (APRESOLINE) tablet 25 mg, 25 mg, Oral, Q6H PRN, Rolly Salter, MD, 25 mg at 12/28/19 1218 .  spironolactone (ALDACTONE) tablet 25 mg, 25 mg, Oral, Daily, 25 mg at 12/28/19 0901 **AND** hydrochlorothiazide (HYDRODIURIL) tablet  25 mg, 25 mg, Oral, Daily, Rolly Salter, MD, 25 mg at 12/28/19 0900 .  HYDROcodone-homatropine (HYCODAN) 5-1.5 MG/5ML syrup 5 mL, 5 mL, Oral, Q4H, Layann Bluett, MD, 5 mL at 12/28/19 1727 .  insulin aspart (novoLOG) injection 0-20 Units, 0-20 Units, Subcutaneous, TID WC, Andris Baumann, MD, 4 Units at 12/28/19 1730 .  insulin aspart (novoLOG) injection 0-5 Units, 0-5 Units, Subcutaneous, QHS, Andris Baumann, MD, 3 Units at 12/27/19 2258 .  insulin aspart (novoLOG) injection 10 Units, 10 Units, Subcutaneous, TID WC, Rolly Salter, MD, 10 Units at 12/28/19 1727 .  insulin detemir (LEVEMIR) injection 26 Units, 26 Units, Subcutaneous, BID, Rolly Salter, MD, 26 Units at 12/28/19 0857 .  labetalol (NORMODYNE) injection 5 mg, 5 mg, Intravenous, Q2H PRN, Rolly Salter, MD, 5 mg at 12/21/19 1547 .  linagliptin (TRADJENTA) tablet 5 mg, 5 mg, Oral, Daily, Lindajo Royal V, MD, 5 mg at 12/28/19 0900 .  melatonin tablet 5 mg, 5 mg, Oral, QHS, Manuela Schwartz, NP, 5 mg at 12/27/19 2258 .  menthol-cetylpyridinium (CEPACOL) lozenge 3 mg, 1 lozenge, Oral, PRN, Rolly Salter, MD .  multivitamin with minerals tablet 1 tablet, 1 tablet, Oral, Daily, Andris Baumann, MD, 1 tablet at 12/28/19 1116 .  nystatin (MYCOSTATIN) 100000 UNIT/ML suspension 500,000 Units, 5 mL, Oral, QID, Rolly Salter, MD, 500,000 Units at 12/28/19 1727 .  ondansetron (ZOFRAN) tablet 4 mg, 4 mg, Oral, Q6H PRN **OR** ondansetron (ZOFRAN) injection 4 mg, 4 mg, Intravenous, Q6H PRN, Andris Baumann, MD, 4 mg at 12/26/19 1020 .  oxymetazoline (AFRIN) 0.05 % nasal spray 1 spray, 1 spray, Each Nare, BID, Rolly Salter, MD, 1 spray at 12/28/19 0910 .  phenol (CHLORASEPTIC) mouth spray 1 spray, 1 spray, Mouth/Throat, PRN, Rolly Salter, MD, 1 spray at 12/23/19 1104 .  protein supplement (ENSURE MAX) liquid, 11 oz, Oral, TID, Rolly Salter, MD, 11 oz at 12/28/19 1731 .  sodium chloride (OCEAN) 0.65 % nasal spray 1 spray, 1 spray,  Each Nare, PRN, Rolly Salter, MD .  sodium chloride flush (NS) 0.9 % injection 10 mL, 10 mL, Intravenous, Q12H, Rolly Salter, MD, 10 mL at 12/28/19 0909 .  zinc sulfate capsule 220 mg, 220 mg, Oral, Daily, Lindajo Royal V, MD, 220 mg at 12/28/19 7322    ALLERGIES   Patient has no known allergies.     REVIEW OF SYSTEMS    Review of Systems:  Gen:  Denies  fever, sweats, chills weigh loss  HEENT: Denies blurred vision, double vision, ear pain, eye pain, hearing loss, nose bleeds, sore throat Cardiac:  No dizziness, chest pain or heaviness, chest tightness,edema Resp:   Cough, dyspnea+ Gi: Denies swallowing difficulty, stomach pain, nausea or vomiting, diarrhea, constipation, bowel incontinence Gu:  Denies bladder incontinence, burning urine Ext:  Denies Joint pain, stiffness or swelling Skin: Denies  skin rash, easy bruising or bleeding or hives Endoc:  Denies polyuria, polydipsia , polyphagia or weight change Psych:   Denies depression, insomnia or hallucinations   Other:  All other systems negative   VS: BP (!) 158/80 (BP Location: Right Arm)   Pulse 89   Temp 97.6 F (36.4 C) (Oral)   Resp 17   Ht 6' (1.829 m)   Wt 133.7 kg   SpO2 92%   BMI 39.98 kg/m      PHYSICAL EXAM    GENERAL:NAD, no fevers, chills, no weakness no fatigue HEAD: Normocephalic, atraumatic.  EYES: Pupils equal, round, reactive to light. Extraocular muscles intact. No scleral icterus.  MOUTH: Moist mucosal membrane. Dentition intact. No abscess noted.  EAR, NOSE, THROAT: Clear without exudates. No external lesions.  NECK: Supple. No thyromegaly. No nodules. No JVD.  PULMONARY:bilateral rhonchi, difficult to appreciate auscultaion due to negativepressure fans CARDIOVASCULAR: S1 and S2. Regular rate and rhythm. No murmurs, rubs, or gallops. No edema. Pedal pulses 2+ bilaterally.  GASTROINTESTINAL: Soft, nontender, nondistended. No masses. Positive bowel sounds. No hepatosplenomegaly.    MUSCULOSKELETAL: No swelling, clubbing, or edema. Range of motion full in all extremities.  NEUROLOGIC: Cranial nerves II through XII are intact. No gross focal neurological deficits. Sensation intact. Reflexes intact.  SKIN: No ulceration, lesions, rashes, or cyanosis. Skin warm and dry. Turgor intact.  PSYCHIATRIC: Mood, affect within normal limits. The patient is awake, alert and oriented x 3. Insight, judgment intact.       IMAGING    DG Chest 1 View  Result Date: 12/19/2019 CLINICAL DATA:  Fever, cough, COVID EXAM: CHEST  1 VIEW COMPARISON:  None. FINDINGS: Heart is normal size. Patchy bilateral airspace opacities. No effusions or pneumothorax. No acute bony abnormality. IMPRESSION: Patchy bilateral airspace disease compatible with COVID pneumonia. Electronically Signed   By: Charlett Nose M.D.   On: 12/19/2019 23:20   CT ANGIO CHEST PE W OR WO CONTRAST  Result Date: 12/25/2019 CLINICAL DATA:  Worsening hypoxia, COVID-19 positive EXAM: CT ANGIOGRAPHY CHEST WITH CONTRAST TECHNIQUE: Multidetector CT imaging of the chest was performed using the standard protocol during bolus administration of intravenous contrast. Multiplanar CT image reconstructions and MIPs were obtained to evaluate the vascular anatomy. CONTRAST:  47mL OMNIPAQUE IOHEXOL 350 MG/ML SOLN COMPARISON:  None. FINDINGS: Cardiovascular: Satisfactory opacification of the pulmonary arteries to the segmental level. No evidence of pulmonary embolism. Normal heart size. No pericardial effusion. Mediastinum/Nodes: No enlarged mediastinal, hilar, or axillary lymph nodes. Thyroid gland, trachea, and esophagus demonstrate no significant findings. Extensive pneumomediastinum dissecting into the neck, right worse than left. Lungs/Pleura: No pleural effusion or pneumothorax. Bilateral patchy ground-glass opacities and airspace disease throughout the lungs consistent with patient's known history of COVID-19 pneumonia. Upper Abdomen: No acute  abnormality. Musculoskeletal: No chest wall abnormality. No acute or significant osseous findings. Review of the MIP images confirms the above findings. IMPRESSION: 1. No evidence of pulmonary embolus. 2. Extensive pneumomediastinum dissecting into the neck, right worse than left. 3. Bilateral patchy ground-glass opacities and airspace disease throughout the lungs consistent with patient's known history of COVID-19 pneumonia. Electronically Signed   By: Elige Ko   On: 12/25/2019 15:04      ASSESSMENT/PLAN   Spontaneous pneumomediastinum - decrease shearing forces to minimize progression - will add scheduled Hycodan for now, will d/c vicodin to prevent overtreatment adverse effect such as respiratory depression   - supportive care only   -  may pause bronchopulmonary hygiene such as vest therapy/incentive spirometery/ etc.   - If progressive and severe will review with thoracic surgery    Acute COVID19 pneumonia -Remdesevir antiviral - pharmacy protocol 5 d -vitamin C -zinc -solumedrol - currently at 60 bid>>40 bid -Diuresis - Lasix 40 IV daily - monitor UOP - utilize external urinary catheter if possible -Self prone if patient can tolerate  -d/c hepatotoxic medications while on remdesevir -supportive care with ICU telemetry monitoring -PT/OT when possible -procalcitonin, CRP and ferritin trending   -patient has not had bowel movement in 7 days, declines bowel regimen   Thank you for allowing me to participate in the care of this patient.   Patient/Family are satisfied with care plan and all questions have been answered.   This document was prepared using Dragon voice recognition software and may include unintentional dictation errors.     Vida Rigger, M.D.  Division of Pulmonary & Critical Care Medicine  [facility]

## 2019-12-28 NOTE — Progress Notes (Addendum)
PROGRESS NOTE    [name]  MEQ:[id number] DOB: 09/24/1970 DOA: 12/19/2019 PCP: Patient, No Pcp Per     Brief Narrative:   49 y.o. BM PMHx DM TYpe II, HTN, obesity, unvaccinated against Covid   Presents to the emergency room with a several day history of fever cough and shortness of breath.  His wife has similar but milder symptoms.  Due to progressing symptoms came into the emergency room for evaluation ED Course: On arrival he was febrile at 102.2 tachypneic at 22 with O2 sat 97% on room air but desaturating to 86 with ambulation.  BP 183/77.  Covid test positive, chest x-ray showed patchy bilateral airspace disease compatible with Covid pneumonia.  Patient started on usual treatment, oxygen.  Hospitalist consulted for admission.  Past medical history of morbid obesity. Patient does not take any medications for diabetes or hypertension. Presents with shortness of breath and found to have COVID-19 pneumonia as well as hypertensive urgency. Currently plan is continue current care for hypoxia.   Subjective: A/O x4, positive S OB, positive DOE negative CP, negative abdominal pain   Assessment & Plan: Covid vaccination; not vaccinated   Principal Problem:   Pneumonia due to COVID-19 virus Active Problems:   Hypertension   Diabetes mellitus without complication (HCC)   Acute respiratory failure due to COVID-19 (HCC)   Obesity, Class III, BMI 40-49.9 (morbid obesity) (HCC)   Acute respiratory failure with hypoxia/Covid pneumonia COVID-19 Labs  Recent Labs    12/26/19 1017 12/27/19 0624 12/27/19 0945 12/28/19 0635  FERRITIN  --   --  1,125* 1,151*  LDH  --   --  694* 575*  CRP 5.0* 9.6*  --   --    Results for [name] (MRN [id number]) as of 12/29/2019 08:00  Ref. Range 12/25/2019 06:11 12/26/2019 10:17 12/27/2019 06:24 12/28/2019 06:35 12/29/2019 06:18  Fibrin derivatives D-dimer ([facility]) Latest Ref Range: 0.00 - 499.00 ng/mL (FEU) 4,336.52 (H) 3,023.69 (H) 4,909.14 (H)  3,560.92 (H) >7,500.00 (H)   Lab Results  Component Value Date   SARSCOV2NAA POSITIVE (A) 12/19/2019  -Remdesivir complete -8/24 Baricitinib for 14-day course -9/2 DC Decadron; Solu-Medrol 60 mg BID -Vitamin C and zinc per Covid protocol  Pneumoperitoneum. -Pulmonary consulted for pneumoperitoneum.  Currently conservative measures with close monitoring.  No incentive spirometry. -The investigational nature of this medication was discussed with the patient/HCPOA and they choose to proceed as the potential benefits are felt to outweigh risks at this time.  -Next week will obtain repeat CT scan.  At that point will discuss treatment options with critical care/surgery.  If not reabsorbing may need to become more aggressive  HTN urgency -9/2 increase amlodipine 10 mg daily -Labetalol PRN -Spironolactone 25 mg daily -HCTZ 25 mg daily   Hypokalemia -Potassium goal> 4  DM type II uncontrolled with hyperglycemia -8/25 hemoglobin A1c= 10.4 -Levemir 26 units BID -NovoLog 10 units `qac -Resistant SSI -Tradjenta 5 mg daily  HLD -9/2 lipid panel pending   Elevated LFT -Mildly elevated  Leukocytosis -Negative left shift negative bands most likely secondary to steroids  Obesity(40.23 kg/m.)    Oral ulcer -Patient has seen urgent care for the same. -Continuing chlorhexidine and Magic mouthwash with lidocaine. -Anticipating poor wound healing in the setting of patient being on steroid as well as baricitinib -Per EMR discussed with ENT on-call, Outpatient ENT follow-up recommended.  Elevated D-dimer. -D-dimer is trending upwards despite improvement in other inflammatory markers. -At present patient does not have any new hypotension or  worsening hypoxia or tachycardia. -CT PE protocol is also negative for pulmonary bleeding.  Oral thrush. -Nystatin Added.  Epistaxis. -Afrin spray scheduled. Also Ocean Spray. Currently resolved.  Obese (BMI 39.9 kg/m) -During acute  illness no place for nutritional consultation.   DVT prophylaxis: Lovenox Code Status: Full Family Communication:  Status is: Inpatient    Dispo: The patient is from: Home              Anticipated d/c is to: Home              Anticipated d/c date is:??              Patient currently extremely unstable      Consultants:  PCCM  Procedures/Significant Events:  8/30 CTA chest PE protocol;No evidence of pulmonary embolus. -Extensive pneumomediastinum dissecting into the neck, RIGHT>> LEFT. -Bilateral patchy ground-glass opacities and airspace disease throughout the lungs consistent with patient's known history of COVID-19 pneumonia.  I have personally reviewed and interpreted all radiology studies and my findings are as above.  VENTILATOR SETTINGS: HFNC+ NRB 9/2 Flow rate; 15 L/min SPO2; 100%   Cultures 8/24 SARS coronavirus positive    Antimicrobials: Anti-infectives (From admission, onward)   Start     Ordered Stop   12/21/19 1000  remdesivir 100 mg in sodium chloride 0.9 % 100 mL IVPB       "Followed by" Linked Group Details   12/20/19 0051 12/24/19 1038   12/21/19 1000  remdesivir 100 mg in sodium chloride 0.9 % 100 mL IVPB  Status:  Discontinued       "Followed by" Linked Group Details   12/20/19 0157 12/20/19 0201   12/20/19 0200  remdesivir 200 mg in sodium chloride 0.9% 250 mL IVPB  Status:  Discontinued       "Followed by" Linked Group Details   12/20/19 0157 12/20/19 0201   12/20/19 0100  remdesivir 200 mg in sodium chloride 0.9% 250 mL IVPB       "Followed by" Linked Group Details   12/20/19 0051 12/20/19 0320       Devices    LINES / TUBES:      Continuous Infusions:   Objective: Vitals:   12/27/19 1622 12/27/19 2001 12/28/19 0309 12/28/19 0741  BP: 138/84 137/86 (!) 152/73 (!) 164/100  Pulse: 99 96 84 76  Resp: 19 20  18   Temp: 97.8 F (36.6 C) 98.1 F (36.7 C) (!) 97.5 F (36.4 C) 97.9 F (36.6 C)  TempSrc:  Oral Oral   SpO2:  98% 95% 100% 100%  Weight:      Height:        Intake/Output Summary (Last 24 hours) at 12/28/2019 02/27/2020 Last data filed at 12/28/2019 0450 Gross per 24 hour  Intake --  Output 2100 ml  Net -2100 ml   Filed Weights   12/25/19 0314 12/26/19 0500 12/27/19 0505  Weight: (!) 137.8 kg 134.5 kg 133.7 kg   Physical Exam:  General: A/O x4, positive  acute respiratory distress Eyes: negative scleral hemorrhage, negative anisocoria, negative icterus ENT: Negative Runny nose, negative gingival bleeding, Neck:  Negative scars, masses, torticollis, lymphadenopathy, JVD Lungs: extremely poor breath sounds bilaterally without wheezes or crackles Cardiovascular: Regular rate and rhythm without murmur gallop or rub normal S1 and S2 Abdomen: OBESE, negative abdominal pain, nondistended, positive soft, bowel sounds, no rebound, no ascites, no appreciable mass Extremities: No significant cyanosis, clubbing, or edema bilateral lower extremities Skin: Negative rashes, lesions, ulcers Psychiatric:  Negative  depression, negative anxiety, negative fatigue, negative mania  Central nervous system:  Cranial nerves II through XII intact, tongue/uvula midline, all extremities muscle strength 5/5, sensation intact throughout, negative dysarthria, negative expressive aphasia, negative receptive aphasia.  .     Data Reviewed: Care during the described time interval was provided by me .  I have reviewed this patient's available data, including medical history, events of note, physical examination, and all test results as part of my evaluation.  CBC: Recent Labs  Lab 12/24/19 0628 12/25/19 0611 12/26/19 1017 12/27/19 0624 12/28/19 0635  WBC 13.1* 13.5* 10.5 10.7* 14.5*  NEUTROABS 10.6* 11.5* 9.2* 9.4* 12.7*  HGB 15.4 15.8 15.2 14.4 14.2  HCT 46.2 46.6 45.3 42.6 41.5  MCV 81.9 82.0 82.1 82.4 79.7*  PLT 270 305 310 286 334   Basic Metabolic Panel: Recent Labs  Lab 12/24/19 0628 12/25/19 0611  12/26/19 1017 12/27/19 0624 12/27/19 0945 12/28/19 0635  NA 134* 135 133* 132*  --  130*  K 3.8 4.0 4.2 4.5  --  4.2  CL 96* 96* 95* 96*  --  94*  CO2 26 28 28 26   --  26  GLUCOSE 240* 185* 226* 215*  --  301*  BUN 26* 20 27* 21*  --  27*  CREATININE 0.85 0.99 1.00 0.85  --  1.12  CALCIUM 8.4* 8.6* 8.5* 8.5*  --  8.6*  MG 2.5* 2.2 2.3 2.3  --  2.3  PHOS  --   --   --   --  3.7 3.4   GFR: Estimated Creatinine Clearance: 115.3 mL/min (by C-G formula based on SCr of 1.12 mg/dL). Liver Function Tests: Recent Labs  Lab 12/24/19 0628 12/25/19 0611 12/26/19 1017 12/27/19 0624 12/28/19 0635  AST 71* 73* 53* 42* 30  ALT 102* 109* 103* 91* 80*  ALKPHOS 91 90 71 58 67  BILITOT 1.0 1.2 1.0 1.2 0.9  PROT 6.7 7.3 6.9 6.6 6.9  ALBUMIN 3.3* 3.4* 3.2* 3.1* 3.0*   No results for input(s): LIPASE, AMYLASE in the last 168 hours. No results for input(s): AMMONIA in the last 168 hours. Coagulation Profile: No results for input(s): INR, PROTIME in the last 168 hours. Cardiac Enzymes: No results for input(s): CKTOTAL, CKMB, CKMBINDEX, TROPONINI in the last 168 hours. BNP (last 3 results) No results for input(s): PROBNP in the last 8760 hours. HbA1C: No results for input(s): HGBA1C in the last 72 hours. CBG: Recent Labs  Lab 12/27/19 0838 12/27/19 1124 12/27/19 1619 12/27/19 1956 12/28/19 0735  GLUCAP 189* 251* 297* 259* 276*   Lipid Profile: No results for input(s): CHOL, HDL, LDLCALC, TRIG, CHOLHDL, LDLDIRECT in the last 72 hours. Thyroid Function Tests: No results for input(s): TSH, T4TOTAL, FREET4, T3FREE, THYROIDAB in the last 72 hours. Anemia Panel: Recent Labs    12/27/19 0945 12/28/19 0635  FERRITIN 1,125* 1,151*   Sepsis Labs: No results for input(s): PROCALCITON, LATICACIDVEN in the last 168 hours.  Recent Results (from the past 240 hour(s))  SARS Coronavirus 2 by RT PCR (hospital order, performed in [facility] hospital lab) Nasopharyngeal Nasopharyngeal Swab      Status: Abnormal   Collection Time: 12/19/19  7:56 PM   Specimen: Nasopharyngeal Swab  Result Value Ref Range Status   SARS Coronavirus 2 POSITIVE (A) NEGATIVE Final    Comment: CRITICAL RESULT CALLED TO, READ BACK BY AND VERIFIED WITH: ASHLEY ORSUTO @ 2127 12/19/2019 RH (NOTE) SARS-CoV-2 target nucleic acids are DETECTED  SARS-CoV-2 RNA is generally detectable  in upper respiratory specimens  during the acute phase of infection.  Positive results are indicative  of the presence of the identified virus, but do not rule out bacterial infection or co-infection with other pathogens not detected by the test.  Clinical correlation with patient history and  other diagnostic information is necessary to determine patient infection status.  The expected result is negative.  Fact Sheet for Patients:   BoilerBrush.com.cyhttps://www.fda.gov/media/136312/download   Fact Sheet for Healthcare Providers:   https://pope.com/https://www.fda.gov/media/136313/download    This test is not yet approved or cleared by the [place] FDA and  has been authorized for detection and/or diagnosis of SARS-CoV-2 by FDA under an Emergency Use Authorization (EUA).  This EUA will remain in effect (meaning  this test can be used) for the duration of  the COVID-19 declaration under Section 564(b)(1) of the Act, 21 U.S.C. section 360-bbb-3(b)(1), unless the authorization is terminated or revoked sooner.  Performed at [facility], [address] [place], [place] [address]   MRSA PCR Screening     Status: None   Collection Time: 12/20/19  8:46 PM   Specimen: Nasal Mucosa; Nasopharyngeal  Result Value Ref Range Status   MRSA by PCR NEGATIVE NEGATIVE Final    Comment:        The GeneXpert MRSA Assay (FDA approved for NASAL specimens only), is one component of a comprehensive MRSA colonization surveillance program. It is not intended to diagnose MRSA infection nor to guide or monitor treatment for MRSA infections. Performed at  [facility], [address] [place], [place] [address]          Radiology Studies: No results found.      Scheduled Meds: . albuterol  2 puff Inhalation Q6H  . amLODipine  5 mg Oral Daily  . vitamin C  500 mg Oral Daily  . baricitinib  4 mg Oral Daily  . chlorhexidine  15 mL Mouth/Throat BID  . cholecalciferol  1,000 Units Oral Daily  . dexamethasone (DECADRON) injection  4 mg Intravenous Q24H  . furosemide  40 mg Intravenous Daily  . spironolactone  25 mg Oral Daily   And  . hydrochlorothiazide  25 mg Oral Daily  . HYDROcodone-homatropine  5 mL Oral Q4H  . insulin aspart  0-20 Units Subcutaneous TID WC  . insulin aspart  0-5 Units Subcutaneous QHS  . insulin aspart  10 Units Subcutaneous TID WC  . insulin detemir  26 Units Subcutaneous BID  . linagliptin  5 mg Oral Daily  . melatonin  5 mg Oral QHS  . multivitamin with minerals  1 tablet Oral Daily  . nystatin  5 mL Oral QID  . oxymetazoline  1 spray Each Nare BID  . Ensure Max Protein  11 oz Oral TID  . sodium chloride flush  10 mL Intravenous Q12H  . zinc sulfate  220 mg Oral Daily   Continuous Infusions:   LOS: 8 days    Time spent:40 min    Azul Coffie, Roselind MessierURTIS J, MD [facility] Pager [phone]  If 7PM-7AM, please contact night-coverage [email or web address] Password [email or web address] 12/28/2019, 8:42 AM

## 2019-12-29 LAB — CBC WITH DIFFERENTIAL/PLATELET
Abs Immature Granulocytes: 0.43 10*3/uL — ABNORMAL HIGH (ref 0.00–0.07)
Basophils Absolute: 0 10*3/uL (ref 0.0–0.1)
Basophils Relative: 0 %
Eosinophils Absolute: 0.1 10*3/uL (ref 0.0–0.5)
Eosinophils Relative: 1 %
HCT: 43.3 % (ref 39.0–52.0)
Hemoglobin: 14.6 g/dL (ref 13.0–17.0)
Immature Granulocytes: 4 %
Lymphocytes Relative: 6 %
Lymphs Abs: 0.7 10*3/uL (ref 0.7–4.0)
MCH: 27.7 pg (ref 26.0–34.0)
MCHC: 33.7 g/dL (ref 30.0–36.0)
MCV: 82 fL (ref 80.0–100.0)
Monocytes Absolute: 0.5 10*3/uL (ref 0.1–1.0)
Monocytes Relative: 4 %
Neutro Abs: 10.5 10*3/uL — ABNORMAL HIGH (ref 1.7–7.7)
Neutrophils Relative %: 85 %
Platelets: 298 10*3/uL (ref 150–400)
RBC: 5.28 MIL/uL (ref 4.22–5.81)
RDW: 15.1 % (ref 11.5–15.5)
WBC: 12.2 10*3/uL — ABNORMAL HIGH (ref 4.0–10.5)
nRBC: 0 % (ref 0.0–0.2)

## 2019-12-29 LAB — LIPID PANEL
Cholesterol: 189 mg/dL (ref 0–200)
HDL: 48 mg/dL (ref >40)
LDL Cholesterol: 109 mg/dL — ABNORMAL HIGH (ref 0–99)
Total CHOL/HDL Ratio: 3.9 RATIO
Triglycerides: 160 mg/dL — ABNORMAL HIGH (ref <150)
VLDL: 32 mg/dL (ref 0–40)

## 2019-12-29 LAB — GLUCOSE, CAPILLARY
Glucose-Capillary: 178 mg/dL — ABNORMAL HIGH (ref 70–99)
Glucose-Capillary: 257 mg/dL — ABNORMAL HIGH (ref 70–99)
Glucose-Capillary: 111 mg/dL — ABNORMAL HIGH (ref 70–99)
Glucose-Capillary: 230 mg/dL — ABNORMAL HIGH (ref 70–99)

## 2019-12-29 LAB — COMPREHENSIVE METABOLIC PANEL
ALT: 70 U/L — ABNORMAL HIGH (ref 0–44)
AST: 26 U/L (ref 15–41)
Albumin: 3.3 g/dL — ABNORMAL LOW (ref 3.5–5.0)
Alkaline Phosphatase: 57 U/L (ref 38–126)
Anion gap: 11 (ref 5–15)
BUN: 23 mg/dL — ABNORMAL HIGH (ref 6–20)
CO2: 24 mmol/L (ref 22–32)
Calcium: 8.9 mg/dL (ref 8.9–10.3)
Chloride: 95 mmol/L — ABNORMAL LOW (ref 98–111)
Creatinine, Ser: 0.98 mg/dL (ref 0.61–1.24)
GFR calc Af Amer: >60 mL/min (ref >60)
GFR calc non Af Amer: >60 mL/min (ref >60)
Glucose, Bld: 186 mg/dL — ABNORMAL HIGH (ref 70–99)
Potassium: 4.1 mmol/L (ref 3.5–5.1)
Sodium: 130 mmol/L — ABNORMAL LOW (ref 135–145)
Total Bilirubin: 0.9 mg/dL (ref 0.3–1.2)
Total Protein: 7.3 g/dL (ref 6.5–8.1)

## 2019-12-29 LAB — FIBRIN DERIVATIVES D-DIMER (ARMC ONLY): Fibrin derivatives D-dimer (ARMC): >7500 ng/mL (FEU) — ABNORMAL HIGH (ref 0.00–499.00)

## 2019-12-29 LAB — PHOSPHORUS: Phosphorus: 4.5 mg/dL (ref 2.5–4.6)

## 2019-12-29 LAB — MAGNESIUM: Magnesium: 2.1 mg/dL (ref 1.7–2.4)

## 2019-12-29 LAB — C-REACTIVE PROTEIN: CRP: 7.2 mg/dL — ABNORMAL HIGH (ref <1.0)

## 2019-12-29 LAB — LACTATE DEHYDROGENASE: LDH: 552 U/L — ABNORMAL HIGH (ref 98–192)

## 2019-12-29 LAB — FERRITIN: Ferritin: 914 ng/mL — ABNORMAL HIGH (ref 24–336)

## 2019-12-29 MED ORDER — PREDNISONE 50 MG PO TABS
50.0000 mg | ORAL_TABLET | Freq: Every day | ORAL | Status: DC
  Administered 2019-12-30 – 2020-01-03 (×5): 50 mg via ORAL
  Filled 2019-12-29 (×5): qty 1

## 2019-12-29 NOTE — Progress Notes (Signed)
PROGRESS NOTE    [name]  HQI:[id number]:[id number] DOB: Jan 03, 1971 DOA: 12/19/2019 PCP: Patient, No Pcp Per     Brief Narrative:   49 y.o. BM PMHx DM TYpe II, HTN, obesity, unvaccinated against Covid   Presents to the emergency room with a several day history of fever cough and shortness of breath.  His wife has similar but milder symptoms.  Due to progressing symptoms came into the emergency room for evaluation ED Course: On arrival he was febrile at 102.2 tachypneic at 22 with O2 sat 97% on room air but desaturating to 86 with ambulation.  BP 183/77.  Covid test positive, chest x-ray showed patchy bilateral airspace disease compatible with Covid pneumonia.  Patient started on usual treatment, oxygen.  Hospitalist consulted for admission.  Past medical history of morbid obesity. Patient does not take any medications for diabetes or hypertension. Presents with shortness of breath and found to have COVID-19 pneumonia as well as hypertensive urgency. Currently plan is continue current care for hypoxia.   Subjective: 9/3 afebrile A/O x4, positive S OB however was able to stay off of O2 for 35 minutes today. Positive DOE. Negative abdominal pain   Assessment & Plan: Covid vaccination; not vaccinated   Principal Problem:   Pneumonia due to COVID-19 virus Active Problems:   Hypertension   Diabetes mellitus without complication (HCC)   Acute respiratory failure due to COVID-19 (HCC)   Obesity, Class III, BMI 40-49.9 (morbid obesity) (HCC)   Acute respiratory failure with hypoxia/Covid pneumonia COVID-19 Labs  Recent Labs    12/26/19 1017 12/27/19 0624 12/27/19 0945 12/28/19 0635 12/29/19 0618  FERRITIN  --   --  1,125* 1,151* 914*  LDH  --   --  694* 575* 552*  CRP 5.0* 9.6*  --  5.7*  --    Results for Scott AlineLOFTIN, Scott L (MRN [id number]) as of 12/29/2019 08:00  Ref. Range 12/25/2019 06:11 12/26/2019 10:17 12/27/2019 06:24 12/28/2019 06:35 12/29/2019 06:18  Fibrin derivatives D-dimer  ([facility]) Latest Ref Range: 0.00 - 499.00 ng/mL (FEU) 4,336.52 (H) 3,023.69 (H) 4,909.14 (H) 3,560.92 (H) >7,500.00 (H)   Lab Results  Component Value Date   SARSCOV2NAA POSITIVE (A) 12/19/2019  -Remdesivir complete -8/24 Baricitinib for 14-day course -9/2 DC Decadron; Solu-Medrol 60 mg BID -Vitamin C and zinc per Covid protocol -Albuterol QID  Pneumoperitoneum. -Pulmonary consulted for pneumoperitoneum.  Currently conservative measures with close monitoring.  No incentive spirometry. -The investigational nature of this medication was discussed with the patient/HCPOA and they choose to proceed as the potential benefits are felt to outweigh risks at this time.  -Next week will obtain repeat CT scan.  At that point will discuss treatment options with critical care/surgery.  If not reabsorbing may need to become more aggressive -9/7 repeat CTA PE protocol pending, reevaluate progression of pneumoperitoneum.  HTN urgency -9/2 increase amlodipine 10 mg daily -Labetalol PRN -Spironolactone 25 mg daily -HCTZ 25 mg daily (hold)  Hyponatremia -We will hold HCTZ  Hypokalemia -Potassium goal> 4  DM type II uncontrolled with hyperglycemia -8/25 hemoglobin A1c= 10.4 -Levemir 26 units BID -NovoLog 10 units `qac -Resistant SSI -Tradjenta 5 mg daily  HLD -9/2 lipid panel pending   Elevated LFT -Mildly elevated  Leukocytosis -Negative left shift negative bands most likely secondary to steroids  Obesity(40.23 kg/m.)    Oral ulcer -Patient has seen urgent care for the same. -Continuing chlorhexidine and Magic mouthwash with lidocaine. -Anticipating poor wound healing in the setting of patient being on steroid as well  as baricitinib -Per EMR discussed with ENT on-call, Outpatient ENT follow-up recommended.  Elevated D-dimer. -D-dimer is trending upwards despite improvement in other inflammatory markers. -At present patient does not have any new hypotension or worsening hypoxia  or tachycardia. -CT PE protocol is also negative for pulmonary bleeding.  Oral thrush. -Nystatin Added.  Epistaxis. -Afrin spray scheduled. Also Ocean Spray. Currently resolved.  Obese (BMI 39.9 kg/m) -During acute illness no place for nutritional consultation.   DVT prophylaxis: Lovenox Code Status: Full Family Communication:  Status is: Inpatient    Dispo: The patient is from: Home              Anticipated d/c is to: Home              Anticipated d/c date is:??              Patient currently extremely unstable      Consultants:  PCCM  Procedures/Significant Events:  8/30 CTA chest PE protocol;No evidence of pulmonary embolus. -Extensive pneumomediastinum dissecting into the neck, RIGHT>> LEFT. -Bilateral patchy ground-glass opacities and airspace disease throughout the lungs consistent with patient's known history of COVID-19 pneumonia.  I have personally reviewed and interpreted all radiology studies and my findings are as above.  VENTILATOR SETTINGS: HFNC 9/3 Flow rate; 12 L/min SPO2; 99%   Cultures 8/24 SARS coronavirus positive    Antimicrobials: Anti-infectives (From admission, onward)   Start     Ordered Stop   12/21/19 1000  remdesivir 100 mg in sodium chloride 0.9 % 100 mL IVPB       "Followed by" Linked Group Details   12/20/19 0051 12/24/19 1038   12/21/19 1000  remdesivir 100 mg in sodium chloride 0.9 % 100 mL IVPB  Status:  Discontinued       "Followed by" Linked Group Details   12/20/19 0157 12/20/19 0201   12/20/19 0200  remdesivir 200 mg in sodium chloride 0.9% 250 mL IVPB  Status:  Discontinued       "Followed by" Linked Group Details   12/20/19 0157 12/20/19 0201   12/20/19 0100  remdesivir 200 mg in sodium chloride 0.9% 250 mL IVPB       "Followed by" Linked Group Details   12/20/19 0051 12/20/19 0320       Devices    LINES / TUBES:      Continuous Infusions:   Objective: Vitals:   12/28/19 2013 12/28/19 2152  12/29/19 0300 12/29/19 0749  BP: (!) 170/76  (!) 143/83 136/90  Pulse: 95  84 84  Resp: 17  18 19   Temp: 97.8 F (36.6 C)   98.7 F (37.1 C)  TempSrc: Oral     SpO2: 99% 94% 99% 100%  Weight:      Height:        Intake/Output Summary (Last 24 hours) at 12/29/2019 0813 Last data filed at 12/28/2019 1559 Gross per 24 hour  Intake --  Output 875 ml  Net -875 ml   Filed Weights   12/25/19 0314 12/26/19 0500 12/27/19 0505  Weight: (!) 137.8 kg 134.5 kg 133.7 kg   Physical Exam:  General: A/O x4, positive  acute respiratory distress Eyes: negative scleral hemorrhage, negative anisocoria, negative icterus ENT: Negative Runny nose, negative gingival bleeding, Neck:  Negative scars, masses, torticollis, lymphadenopathy, JVD Lungs: extremely poor breath sounds bilaterally without wheezes or crackles Cardiovascular: Regular rate and rhythm without murmur gallop or rub normal S1 and S2 Abdomen: OBESE, negative abdominal pain, nondistended, positive soft,  bowel sounds, no rebound, no ascites, no appreciable mass Extremities: No significant cyanosis, clubbing, or edema bilateral lower extremities Skin: Negative rashes, lesions, ulcers Psychiatric:  Negative depression, negative anxiety, negative fatigue, negative mania  Central nervous system:  Cranial nerves II through XII intact, tongue/uvula midline, all extremities muscle strength 5/5, sensation intact throughout, negative dysarthria, negative expressive aphasia, negative receptive aphasia.  .     Data Reviewed: Care during the described time interval was provided by me .  I have reviewed this patient's available data, including medical history, events of note, physical examination, and all test results as part of my evaluation.  CBC: Recent Labs  Lab 12/25/19 0611 12/26/19 1017 12/27/19 0624 12/28/19 0635 12/29/19 0618  WBC 13.5* 10.5 10.7* 14.5* 12.2*  NEUTROABS 11.5* 9.2* 9.4* 12.7* 10.5*  HGB 15.8 15.2 14.4 14.2 14.6  HCT  46.6 45.3 42.6 41.5 43.3  MCV 82.0 82.1 82.4 79.7* 82.0  PLT 305 310 286 334 298   Basic Metabolic Panel: Recent Labs  Lab 12/25/19 0611 12/26/19 1017 12/27/19 0624 12/27/19 0945 12/28/19 0635 12/29/19 0618  NA 135 133* 132*  --  130* 130*  K 4.0 4.2 4.5  --  4.2 4.1  CL 96* 95* 96*  --  94* 95*  CO2 28 28 26   --  26 24  GLUCOSE 185* 226* 215*  --  301* 186*  BUN 20 27* 21*  --  27* 23*  CREATININE 0.99 1.00 0.85  --  1.12 0.98  CALCIUM 8.6* 8.5* 8.5*  --  8.6* 8.9  MG 2.2 2.3 2.3  --  2.3 2.1  PHOS  --   --   --  3.7 3.4 4.5   GFR: Estimated Creatinine Clearance: 131.8 mL/min (by C-G formula based on SCr of 0.98 mg/dL). Liver Function Tests: Recent Labs  Lab 12/25/19 0611 12/26/19 1017 12/27/19 0624 12/28/19 0635 12/29/19 0618  AST 73* 53* 42* 30 26  ALT 109* 103* 91* 80* 70*  ALKPHOS 90 71 58 67 57  BILITOT 1.2 1.0 1.2 0.9 0.9  PROT 7.3 6.9 6.6 6.9 7.3  ALBUMIN 3.4* 3.2* 3.1* 3.0* 3.3*   No results for input(s): LIPASE, AMYLASE in the last 168 hours. No results for input(s): AMMONIA in the last 168 hours. Coagulation Profile: No results for input(s): INR, PROTIME in the last 168 hours. Cardiac Enzymes: No results for input(s): CKTOTAL, CKMB, CKMBINDEX, TROPONINI in the last 168 hours. BNP (last 3 results) No results for input(s): PROBNP in the last 8760 hours. HbA1C: No results for input(s): HGBA1C in the last 72 hours. CBG: Recent Labs  Lab 12/28/19 0735 12/28/19 1140 12/28/19 1658 12/28/19 2033 12/29/19 0746  GLUCAP 276* 270* 164* 110* 178*   Lipid Profile: Recent Labs    12/29/19 0618  CHOL 189  HDL 48  LDLCALC 109*  TRIG 160*  CHOLHDL 3.9   Thyroid Function Tests: No results for input(s): TSH, T4TOTAL, FREET4, T3FREE, THYROIDAB in the last 72 hours. Anemia Panel: Recent Labs    12/28/19 0635 12/29/19 0618  FERRITIN 1,151* 914*   Sepsis Labs: No results for input(s): PROCALCITON, LATICACIDVEN in the last 168 hours.  Recent  Results (from the past 240 hour(s))  SARS Coronavirus 2 by RT PCR (hospital order, performed in [facility] hospital lab) Nasopharyngeal Nasopharyngeal Swab     Status: Abnormal   Collection Time: 12/19/19  7:56 PM   Specimen: Nasopharyngeal Swab  Result Value Ref Range Status   SARS Coronavirus 2 POSITIVE (A) NEGATIVE  Final    Comment: CRITICAL RESULT CALLED TO, READ BACK BY AND VERIFIED WITH: ASHLEY ORSUTO @ 2127 12/19/2019 RH (NOTE) SARS-CoV-2 target nucleic acids are DETECTED  SARS-CoV-2 RNA is generally detectable in upper respiratory specimens  during the acute phase of infection.  Positive results are indicative  of the presence of the identified virus, but do not rule out bacterial infection or co-infection with other pathogens not detected by the test.  Clinical correlation with patient history and  other diagnostic information is necessary to determine patient infection status.  The expected result is negative.  Fact Sheet for Patients:   BoilerBrush.com.cy   Fact Sheet for Healthcare Providers:   https://pope.com/    This test is not yet approved or cleared by the [place] FDA and  has been authorized for detection and/or diagnosis of SARS-CoV-2 by FDA under an Emergency Use Authorization (EUA).  This EUA will remain in effect (meaning  this test can be used) for the duration of  the COVID-19 declaration under Section 564(b)(1) of the Act, 21 U.S.C. section 360-bbb-3(b)(1), unless the authorization is terminated or revoked sooner.  Performed at [facility], [address] [place], [place] [address]   MRSA PCR Screening     Status: None   Collection Time: 12/20/19  8:46 PM   Specimen: Nasal Mucosa; Nasopharyngeal  Result Value Ref Range Status   MRSA by PCR NEGATIVE NEGATIVE Final    Comment:        The GeneXpert MRSA Assay (FDA approved for NASAL specimens only), is one component of a comprehensive  MRSA colonization surveillance program. It is not intended to diagnose MRSA infection nor to guide or monitor treatment for MRSA infections. Performed at [facility], [address] [place], [place] [address]          Radiology Studies: No results found.      Scheduled Meds: . albuterol  2 puff Inhalation Q6H  . amLODipine  10 mg Oral Daily  . vitamin C  500 mg Oral Daily  . baricitinib  4 mg Oral Daily  . chlorhexidine  15 mL Mouth/Throat BID  . cholecalciferol  1,000 Units Oral Daily  . dexamethasone (DECADRON) injection  4 mg Intravenous Q24H  . furosemide  40 mg Intravenous Daily  . spironolactone  25 mg Oral Daily   And  . hydrochlorothiazide  25 mg Oral Daily  . HYDROcodone-homatropine  5 mL Oral Q4H  . insulin aspart  0-20 Units Subcutaneous TID WC  . insulin aspart  0-5 Units Subcutaneous QHS  . insulin aspart  10 Units Subcutaneous TID WC  . insulin detemir  26 Units Subcutaneous BID  . linagliptin  5 mg Oral Daily  . melatonin  5 mg Oral QHS  . multivitamin with minerals  1 tablet Oral Daily  . nystatin  5 mL Oral QID  . oxymetazoline  1 spray Each Nare BID  . Ensure Max Protein  11 oz Oral TID  . sodium chloride flush  10 mL Intravenous Q12H  . zinc sulfate  220 mg Oral Daily   Continuous Infusions:   LOS: 9 days    Time spent:40 min    Lakelyn Straus, Roselind Messier, MD [facility] Pager [phone]  If 7PM-7AM, please contact night-coverage [email or web address] Password [facility] 12/29/2019, 8:13 AM

## 2019-12-29 NOTE — Progress Notes (Addendum)
Inpatient Diabetes Program Recommendations  AACE/ADA: New Consensus Statement on Inpatient Glycemic Control (2015)  Target Ranges:  Prepandial:   less than 140 mg/dL      Peak postprandial:   less than 180 mg/dL (1-2 hours)      Critically ill patients:  140 - 180 mg/dL   Results for ARTUR, WINNINGHAM (MRN [id number]) as of 12/29/2019 11:59  Ref. Range 12/28/2019 07:35 12/28/2019 11:40 12/28/2019 16:58 12/28/2019 20:33  Glucose-Capillary Latest Ref Range: 70 - 99 mg/dL 276 (H)  21 units NOVOLOG +  26 units LEVEMIR  270 (H)  21 units NOVOLOG  164 (H)  14 units NOVOLOG  110 (H)     26 units LEVEMIR   Results for ARLESTER, KEEHAN (MRN [id number]) as of 12/29/2019 11:59  Ref. Range 12/29/2019 07:46 12/29/2019 11:41  Glucose-Capillary Latest Ref Range: 70 - 99 mg/dL 178 (H) 257 (H)     Home DM Meds: None  Current Orders: Levemir 26 units BID      Novolog Resistant Correction Scale/ SSI (0-20 units) TID AC + HS      Novolog 10 units TID with meals      Tradjenta 5 mg Daily     Switched to Decadron 4 mg Daily on 09/01.  Post-meal CBGs remain elevated.   MD- Please consider increasing the Novolog Meal Coverage to 12 units TID with meals   Addendum 3:30pm--Given permission by RN to call pt in his room to answer any additional diabetes questions.  RN gave me permission to call and said pt was interested in speaking with me.  When I called, pt told me he was "not up to talking" right now. He seemed a little upset that I was calling and told me he didn't feel well enough to talk. I asked him if he had been practicing insulin and he told me he has not given any "insulin pen" injections. I asked him if I could send him a You Tube video that would walk him thru using an insulin pen step by step but he wouldn't provide an email address for me and told me his phone won't connect to the internet. I told him about the free wi-fi available and he said it still wouldn't work. Pt sounded upset and asked  why someone wasn't in the room right now to show him the insulin pen.  Pt stated that he didn't want to watch a video and would rather have a person show him how.  I explained to him that the Diabetes nurses are available by phone only today due to minimal staffing and that his RN can show his how to use an insulin pen as well.  Pt then proceeded to hang up on me.  Alerted RN to the above.  Have asked RN to please show pt how to use an insulin pen with the hospital insulin pen teaching station on the unit.  Unable to send any emails due to pt not providing me with an email address.  Of note, pt was given an Herbalist at 6pm on 08/27 (documented in the [facility]).  This kit has an Banker as well.   --Will follow patient during hospitalization--  Wyn Quaker RN, MSN, CDE Diabetes Coordinator Inpatient Glycemic Control Team Team Pager: [phone] (8a-5p)

## 2019-12-29 NOTE — Discharge Instructions (Signed)
Insulin Injection Instructions, Using Insulin Pens, Adult A subcutaneous injection is a shot of medicine that is injected into the layer of fat and tissue between skin and muscle. People with type 1 diabetes must take insulin because their bodies do not make it. People with type 2 diabetes may need to take insulin. There are many different types of insulin. The type of insulin that you take may determine how many injections you give yourself and when you need to give the injections. Supplies needed:  Soap and water to wash hands.  Your insulin pen.  A new, unused needle.  Alcohol wipes.  A disposal container that is meant for sharp items (sharps container), such as an empty plastic bottle with a cover. How to choose a site for injection The body absorbs insulin differently, depending on where the insulin is injected (injection site). It is best to inject insulin into the same body area each time (for example, always in the abdomen), but you should use a different spot in that area for each injection. Do not inject the insulin in the same spot each time. There are five main areas that can be used for injecting. These areas include:  Abdomen. This is the preferred area.  Front of thigh.  Upper, outer side of thigh.  Upper, outer side of arm.  Upper, outer part of buttock. How to use an insulin pen  First, follow the steps for Get ready, then continue with the steps for Inject the insulin. Get ready 1. Wash your hands with soap and water. If soap and water are not available, use hand sanitizer. 2. Before you give yourself an insulin injection, be sure to test your blood sugar level (blood glucose level) and write down that number. Follow any instructions from your health care provider about what to do if your blood glucose level is higher or lower than your normal range. 3. Check the expiration date and the type of insulin that is in the pen. 4. If you are using CLEAR insulin, check to  see that it is clear and free of clumps. 5. If you are using CLOUDY insulin, do not shake the pen to get the injection ready. Instead, get it ready in one of these ways: ? Gently roll the pen between your palms several times. ? Tip the pen up and down several times. 6. Remove the cap from the insulin pen. 7. Use an alcohol wipe to clean the rubber tip of the pen. 8. Remove the protective paper tab from the disposable needle. Do not let the needle touch anything. 9. Screw a new, unused needle onto the pen. 10. Remove the outer plastic needle cover. Do not throw away the outer plastic cover yet. ? If the pen uses a special safety needle, leave the inner needle shield in place. ? If the pen does not use a special safety needle, remove the inner plastic cover from the needle. 11. Follow the manufacturer's instructions to prime the insulin pen with the volume of insulin needed. Hold the pen with the needle pointing up, and push the button on the opposite end of the pen until a drop of insulin appears at the needle tip. If no insulin appears, repeat this step. 12. Turn the button (dial) to the number of units of insulin that you will be injecting. Inject the insulin 1. Use an alcohol wipe to clean the site where you will be injecting the needle. Let the site air-dry. 2. Hold the pen in the   in the palm of your writing hand like a pencil. 3. If directed by your health care provider, use your other hand to pinch and hold about an inch (2.5 cm) of skin at the injection site. Do not directly touch the cleaned part of the skin. 4. Gently but quickly, use your writing hand to put the needle straight into the skin. The needle should be at a 90-degree angle (perpendicular) to the skin. 5. When the needle is completely inserted into the skin, use your thumb or index finger of your writing hand to push the top button of the pen down all the way to inject the insulin. 6. Let go of the skin that you are pinching.  Continue to hold the pen in place with your writing hand. 7. Wait 10 seconds, then pull the needle straight out of the skin. This will allow all of the insulin to go from the pen and needle into your body. 8. Carefully put the larger (outer) plastic cover of the needle back over the needle, then unscrew the capped needle and discard it in a sharps container, such as an empty plastic bottle with a cover. 9. Put the plastic cap back on the insulin pen. How to throw away supplies  Discard all used needles in a puncture-proof sharps disposal container. You can ask your local pharmacy about where you can get this kind of disposal container, or you can use an empty plastic liquid laundry detergent bottle that has a cover.  Follow the disposal regulations for the area where you live. Do not use any needle more than one time.  Throw away empty disposable pens in the regular trash. Questions to ask your health care provider  How often should I be taking insulin?  How often should I check my blood glucose?  What amount of insulin should I be taking at each time?  What are the side effects?  What should I do if my blood glucose is too high?  What should I do if my blood glucose is too low?  What should I do if I forget to take my insulin?  What number should I call if I have questions? Where to find more information  American Diabetes Association (ADA): www.diabetes.org  American Association of Diabetes Educators (AADE) Patient Resources: https://www.diabeteseducator.org Summary  A subcutaneous injection is a shot of medicine that is injected into the layer of fat and tissue between skin and muscle.  Before you give yourself an insulin injection, be sure to test your blood sugar level (blood glucose level) and write down that number.  Check the expiration date and the type of insulin that is in the pen. The type of insulin that you take may determine how many injections you give yourself  and when you need to give the injections.  It is best to inject insulin into the same body area each time (for example, always in the abdomen), but you should use a different spot in that area for each injection. This information is not intended to replace advice given to you by your health care provider. Make sure you discuss any questions you have with your health care provider. Document Revised: 05/03/2017 Document Reviewed: 05/17/2015 Elsevier Patient Education  2020 [employer].

## 2019-12-29 NOTE — Progress Notes (Signed)
Pulmonary Medicine          Date: 12/29/2019,   MRN# [id number] [name] 1970-11-30     AdmissionWeight: (!) 142.8 kg                 CurrentWeight: 133.7 kg  Referring physician: Dr Allena Katz    CHIEF COMPLAINT:   Acute hypoxemic respiratory failure due to COVID19   SUBJECTIVE     [name] is a 49 y.o. male with medical history significant for diabetes, hypertension and obesity, unvaccinated against Covid who presents to the emergency room with a several day history of fever cough and shortness of breath.  His wife has similar but milder symptoms.  Due to progressing symptoms came into the emergency room for evaluation.   On arrival he was febrile at 102.2 tachypneic at 22 with O2 sat 97% on room air but desaturating to 86 with ambulation.  BP 183/77.  Covid test positive, chest x-ray showed patchy bilateral airspace disease compatible with Covid pneumonia.  Patient started on usual treatment, oxygen.  Hospitalist consulted for admission.  Patient was then treated for hypertensive emergency. Patient had additional chest imaging with pneumomediastinum with pulmonary consultation placed for further evaluation and management.     12/26/19- patient is in no distress, we discussed pneumomediastinum. He is feels cough is improved with hycodan. Continue current care plan.   12/27/19- Patient is weaned to 6L/min, he enjoys higher setting and wants 13L/min. 12/28/19- patient continues to use unnecessarily high oxygen therapy. He is 100% saturation. Please wean with goal sPO2 88-92.  12/29/19- ordered POC evaluation with [facility]. Patient is down to 5L/min.  9/12/29/19 12/27/19  PAST MEDICAL HISTORY   Past Medical History:  Diagnosis Date  . COVID-19 12/19/2019   Diagnosed at [facility] on 12/19/2019  . Diabetes mellitus without complication (HCC)   . History of tobacco use   . Hyperlipidemia   . Hypertension   . Obesity      SURGICAL HISTORY   History reviewed. No  pertinent surgical history.   FAMILY HISTORY   Family History  Problem Relation Age of Onset  . Hypertension Mother   . Diabetes Mother   . Diabetes Maternal Grandmother      SOCIAL HISTORY   Social History   Tobacco Use  . Smoking status: Former Smoker    Types: Cigarettes  . Smokeless tobacco: Never Used  . Tobacco comment: occasional  Vaping Use  . Vaping Use: Never used  Substance Use Topics  . Alcohol use: No    Alcohol/week: 0.0 standard drinks  . Drug use: No     MEDICATIONS    Home Medication:    Current Medication:  Current Facility-Administered Medications:  .  acetaminophen (TYLENOL) tablet 650 mg, 650 mg, Oral, Q6H PRN, Andris Baumann, MD, 650 mg at 12/20/19 2348 .  albuterol (VENTOLIN HFA) 108 (90 Base) MCG/ACT inhaler 2 puff, 2 puff, Inhalation, Q6H, Andris Baumann, MD, 2 puff at 12/29/19 1507 .  amLODipine (NORVASC) tablet 10 mg, 10 mg, Oral, Daily, Drema Dallas, MD, 10 mg at 12/29/19 1012 .  ascorbic acid (VITAMIN C) tablet 500 mg, 500 mg, Oral, Daily, Lindajo Royal V, MD, 500 mg at 12/29/19 1012 .  baricitinib (OLUMIANT) tablet 4 mg, 4 mg, Oral, Daily, Lindajo Royal V, MD, 4 mg at 12/29/19 1012 .  chlorhexidine (PERIDEX) 0.12 % solution 15 mL, 15 mL, Mouth/Throat, BID, Rolly Salter, MD, 15 mL at 12/29/19 1011 .  chlorpheniramine-HYDROcodone (TUSSIONEX) 10-8 MG/5ML suspension 5 mL, 5 mL, Oral, Q12H PRN, Andris Baumann, MD, 5 mL at 12/25/19 2000 .  cholecalciferol (VITAMIN D3) tablet 1,000 Units, 1,000 Units, Oral, Daily, Rolly Salter, MD, 1,000 Units at 12/29/19 1013 .  dexamethasone (DECADRON) injection 4 mg, 4 mg, Intravenous, Q24H, Bon Dowis, MD, 4 mg at 12/28/19 2057 .  furosemide (LASIX) injection 40 mg, 40 mg, Intravenous, Daily, Karna Christmas, Verona Hartshorn, MD, 40 mg at 12/29/19 1010 .  hydrALAZINE (APRESOLINE) tablet 25 mg, 25 mg, Oral, Q6H PRN, Rolly Salter, MD, 25 mg at 12/28/19 1218 .  spironolactone (ALDACTONE) tablet 25 mg, 25  mg, Oral, Daily, 25 mg at 12/29/19 1013 **AND** hydrochlorothiazide (HYDRODIURIL) tablet 25 mg, 25 mg, Oral, Daily, Rolly Salter, MD, 25 mg at 12/29/19 1013 .  HYDROcodone-homatropine (HYCODAN) 5-1.5 MG/5ML syrup 5 mL, 5 mL, Oral, Q4H, Ariel Dimitri, MD, 5 mL at 12/29/19 1245 .  insulin aspart (novoLOG) injection 0-20 Units, 0-20 Units, Subcutaneous, TID WC, Andris Baumann, MD, 11 Units at 12/29/19 1245 .  insulin aspart (novoLOG) injection 0-5 Units, 0-5 Units, Subcutaneous, QHS, Andris Baumann, MD, 3 Units at 12/27/19 2258 .  insulin aspart (novoLOG) injection 10 Units, 10 Units, Subcutaneous, TID WC, Rolly Salter, MD, 10 Units at 12/29/19 1245 .  insulin detemir (LEVEMIR) injection 26 Units, 26 Units, Subcutaneous, BID, Rolly Salter, MD, 26 Units at 12/29/19 1012 .  labetalol (NORMODYNE) injection 5 mg, 5 mg, Intravenous, Q2H PRN, Rolly Salter, MD, 5 mg at 12/21/19 1547 .  linagliptin (TRADJENTA) tablet 5 mg, 5 mg, Oral, Daily, Lindajo Royal V, MD, 5 mg at 12/29/19 1010 .  melatonin tablet 5 mg, 5 mg, Oral, QHS, Manuela Schwartz, NP, 5 mg at 12/28/19 2237 .  menthol-cetylpyridinium (CEPACOL) lozenge 3 mg, 1 lozenge, Oral, PRN, Rolly Salter, MD .  multivitamin with minerals tablet 1 tablet, 1 tablet, Oral, Daily, Andris Baumann, MD, 1 tablet at 12/29/19 1013 .  nystatin (MYCOSTATIN) 100000 UNIT/ML suspension 500,000 Units, 5 mL, Oral, QID, Rolly Salter, MD, 500,000 Units at 12/29/19 1506 .  ondansetron (ZOFRAN) tablet 4 mg, 4 mg, Oral, Q6H PRN **OR** ondansetron (ZOFRAN) injection 4 mg, 4 mg, Intravenous, Q6H PRN, Andris Baumann, MD, 4 mg at 12/26/19 1020 .  oxymetazoline (AFRIN) 0.05 % nasal spray 1 spray, 1 spray, Each Nare, BID, Rolly Salter, MD, 1 spray at 12/28/19 2237 .  phenol (CHLORASEPTIC) mouth spray 1 spray, 1 spray, Mouth/Throat, PRN, Rolly Salter, MD, 1 spray at 12/23/19 1104 .  protein supplement (ENSURE MAX) liquid, 11 oz, Oral, TID, Rolly Salter, MD,  11 oz at 12/29/19 1014 .  sodium chloride (OCEAN) 0.65 % nasal spray 1 spray, 1 spray, Each Nare, PRN, Rolly Salter, MD .  sodium chloride flush (NS) 0.9 % injection 10 mL, 10 mL, Intravenous, Q12H, Rolly Salter, MD, 10 mL at 12/29/19 1014 .  zinc sulfate capsule 220 mg, 220 mg, Oral, Daily, Lindajo Royal V, MD, 220 mg at 12/29/19 1013    ALLERGIES   Patient has no known allergies.     REVIEW OF SYSTEMS    Review of Systems:  Gen:  Denies  fever, sweats, chills weigh loss  HEENT: Denies blurred vision, double vision, ear pain, eye pain, hearing loss, nose bleeds, sore throat Cardiac:  No dizziness, chest pain or heaviness, chest tightness,edema Resp:   Cough, dyspnea+ Gi: Denies swallowing difficulty, stomach pain, nausea or vomiting, diarrhea, constipation,  bowel incontinence Gu:  Denies bladder incontinence, burning urine Ext:   Denies Joint pain, stiffness or swelling Skin: Denies  skin rash, easy bruising or bleeding or hives Endoc:  Denies polyuria, polydipsia , polyphagia or weight change Psych:   Denies depression, insomnia or hallucinations   Other:  All other systems negative   VS: BP 122/73 (BP Location: Right Arm)   Pulse 99   Temp 97.6 F (36.4 C) (Oral)   Resp (!) 24   Ht 6' (1.829 m)   Wt 133.7 kg   SpO2 96%   BMI 39.98 kg/m      PHYSICAL EXAM    GENERAL:NAD, no fevers, chills, no weakness no fatigue HEAD: Normocephalic, atraumatic.  EYES: Pupils equal, round, reactive to light. Extraocular muscles intact. No scleral icterus.  MOUTH: Moist mucosal membrane. Dentition intact. No abscess noted.  EAR, NOSE, THROAT: Clear without exudates. No external lesions.  NECK: Supple. No thyromegaly. No nodules. No JVD.  PULMONARY:bilateral rhonchi, difficult to appreciate auscultaion due to negativepressure fans CARDIOVASCULAR: S1 and S2. Regular rate and rhythm. No murmurs, rubs, or gallops. No edema. Pedal pulses 2+ bilaterally.  GASTROINTESTINAL:  Soft, nontender, nondistended. No masses. Positive bowel sounds. No hepatosplenomegaly.  MUSCULOSKELETAL: No swelling, clubbing, or edema. Range of motion full in all extremities.  NEUROLOGIC: Cranial nerves II through XII are intact. No gross focal neurological deficits. Sensation intact. Reflexes intact.  SKIN: No ulceration, lesions, rashes, or cyanosis. Skin warm and dry. Turgor intact.  PSYCHIATRIC: Mood, affect within normal limits. The patient is awake, alert and oriented x 3. Insight, judgment intact.       IMAGING    DG Chest 1 View  Result Date: 12/19/2019 CLINICAL DATA:  Fever, cough, COVID EXAM: CHEST  1 VIEW COMPARISON:  None. FINDINGS: Heart is normal size. Patchy bilateral airspace opacities. No effusions or pneumothorax. No acute bony abnormality. IMPRESSION: Patchy bilateral airspace disease compatible with COVID pneumonia. Electronically Signed   By: Charlett Nose M.D.   On: 12/19/2019 23:20   CT ANGIO CHEST PE W OR WO CONTRAST  Result Date: 12/25/2019 CLINICAL DATA:  Worsening hypoxia, COVID-19 positive EXAM: CT ANGIOGRAPHY CHEST WITH CONTRAST TECHNIQUE: Multidetector CT imaging of the chest was performed using the standard protocol during bolus administration of intravenous contrast. Multiplanar CT image reconstructions and MIPs were obtained to evaluate the vascular anatomy. CONTRAST:  27mL OMNIPAQUE IOHEXOL 350 MG/ML SOLN COMPARISON:  None. FINDINGS: Cardiovascular: Satisfactory opacification of the pulmonary arteries to the segmental level. No evidence of pulmonary embolism. Normal heart size. No pericardial effusion. Mediastinum/Nodes: No enlarged mediastinal, hilar, or axillary lymph nodes. Thyroid gland, trachea, and esophagus demonstrate no significant findings. Extensive pneumomediastinum dissecting into the neck, right worse than left. Lungs/Pleura: No pleural effusion or pneumothorax. Bilateral patchy ground-glass opacities and airspace disease throughout the lungs  consistent with patient's known history of COVID-19 pneumonia. Upper Abdomen: No acute abnormality. Musculoskeletal: No chest wall abnormality. No acute or significant osseous findings. Review of the MIP images confirms the above findings. IMPRESSION: 1. No evidence of pulmonary embolus. 2. Extensive pneumomediastinum dissecting into the neck, right worse than left. 3. Bilateral patchy ground-glass opacities and airspace disease throughout the lungs consistent with patient's known history of COVID-19 pneumonia. Electronically Signed   By: Elige Ko   On: 12/25/2019 15:04      ASSESSMENT/PLAN   Spontaneous pneumomediastinum - decrease shearing forces to minimize progression - will add scheduled Hycodan for now, will d/c vicodin to prevent overtreatment adverse effect such as  respiratory depression   - supportive care only   - may pause bronchopulmonary hygiene such as vest therapy/incentive spirometery/ etc.   - If progressive and severe will review with thoracic surgery    Acute COVID19 pneumonia -Remdesevir antiviral - pharmacy protocol 5 d -vitamin C -zinc -solumedrol - currently at 60 bid>>40 bid>>decadron 4>>pred 50 with taper by 10mg  daily  -Diuresis - Lasix 40 IV daily - monitor UOP - utilize external urinary catheter if possible -Self prone if patient can tolerate  -d/c hepatotoxic medications while on remdesevir -supportive care with ICU telemetry monitoring -PT/OT when possible -procalcitonin, CRP and ferritin trending   -patient has not had bowel movement in 7 days, declines bowel regimen   Thank you for allowing me to participate in the care of this patient.   Patient/Family are satisfied with care plan and all questions have been answered.   This document was prepared using Dragon voice recognition software and may include unintentional dictation errors.     Vida RiggerFuad Jackelyn Illingworth, M.D.  Division of Pulmonary & Critical Care Medicine  [facility]

## 2019-12-30 LAB — COMPREHENSIVE METABOLIC PANEL
ALT: 56 U/L — ABNORMAL HIGH (ref 0–44)
AST: 28 U/L (ref 15–41)
Albumin: 3.1 g/dL — ABNORMAL LOW (ref 3.5–5.0)
Alkaline Phosphatase: 54 U/L (ref 38–126)
Anion gap: 12 (ref 5–15)
BUN: 20 mg/dL (ref 6–20)
CO2: 26 mmol/L (ref 22–32)
Calcium: 8.6 mg/dL — ABNORMAL LOW (ref 8.9–10.3)
Chloride: 93 mmol/L — ABNORMAL LOW (ref 98–111)
Creatinine, Ser: 1.02 mg/dL (ref 0.61–1.24)
GFR calc Af Amer: >60 mL/min (ref >60)
GFR calc non Af Amer: >60 mL/min (ref >60)
Glucose, Bld: 112 mg/dL — ABNORMAL HIGH (ref 70–99)
Potassium: 3.6 mmol/L (ref 3.5–5.1)
Sodium: 131 mmol/L — ABNORMAL LOW (ref 135–145)
Total Bilirubin: 0.9 mg/dL (ref 0.3–1.2)
Total Protein: 7.4 g/dL (ref 6.5–8.1)

## 2019-12-30 LAB — CBC WITH DIFFERENTIAL/PLATELET
Abs Immature Granulocytes: 0.43 10*3/uL — ABNORMAL HIGH (ref 0.00–0.07)
Basophils Absolute: 0 10*3/uL (ref 0.0–0.1)
Basophils Relative: 0 %
Eosinophils Absolute: 0.2 10*3/uL (ref 0.0–0.5)
Eosinophils Relative: 2 %
HCT: 43.9 % (ref 39.0–52.0)
Hemoglobin: 15.1 g/dL (ref 13.0–17.0)
Immature Granulocytes: 4 %
Lymphocytes Relative: 13 %
Lymphs Abs: 1.6 10*3/uL (ref 0.7–4.0)
MCH: 27.9 pg (ref 26.0–34.0)
MCHC: 34.4 g/dL (ref 30.0–36.0)
MCV: 81.1 fL (ref 80.0–100.0)
Monocytes Absolute: 0.8 10*3/uL (ref 0.1–1.0)
Monocytes Relative: 6 %
Neutro Abs: 9.3 10*3/uL — ABNORMAL HIGH (ref 1.7–7.7)
Neutrophils Relative %: 75 %
Platelets: 310 10*3/uL (ref 150–400)
RBC: 5.41 MIL/uL (ref 4.22–5.81)
RDW: 15.1 % (ref 11.5–15.5)
WBC: 12.3 10*3/uL — ABNORMAL HIGH (ref 4.0–10.5)
nRBC: 0 % (ref 0.0–0.2)

## 2019-12-30 LAB — GLUCOSE, CAPILLARY
Glucose-Capillary: 173 mg/dL — ABNORMAL HIGH (ref 70–99)
Glucose-Capillary: 199 mg/dL — ABNORMAL HIGH (ref 70–99)
Glucose-Capillary: 248 mg/dL — ABNORMAL HIGH (ref 70–99)
Glucose-Capillary: 311 mg/dL — ABNORMAL HIGH (ref 70–99)

## 2019-12-30 LAB — MAGNESIUM: Magnesium: 2 mg/dL (ref 1.7–2.4)

## 2019-12-30 LAB — FERRITIN: Ferritin: 1051 ng/mL — ABNORMAL HIGH (ref 24–336)

## 2019-12-30 LAB — PHOSPHORUS: Phosphorus: 4 mg/dL (ref 2.5–4.6)

## 2019-12-30 LAB — LACTATE DEHYDROGENASE: LDH: 499 U/L — ABNORMAL HIGH (ref 98–192)

## 2019-12-30 LAB — FIBRIN DERIVATIVES D-DIMER (ARMC ONLY): Fibrin derivatives D-dimer (ARMC): >7500 ng/mL (FEU) — ABNORMAL HIGH (ref 0.00–499.00)

## 2019-12-30 LAB — C-REACTIVE PROTEIN: CRP: 10.5 mg/dL — ABNORMAL HIGH (ref <1.0)

## 2019-12-30 MED ORDER — FLUCONAZOLE IN SODIUM CHLORIDE 200-0.9 MG/100ML-% IV SOLN
200.0000 mg | Freq: Once | INTRAVENOUS | Status: AC
  Administered 2019-12-30: 200 mg via INTRAVENOUS
  Filled 2019-12-30 (×2): qty 100

## 2019-12-30 MED ORDER — FLUCONAZOLE 100MG IVPB
100.0000 mg | INTRAVENOUS | Status: DC
  Administered 2020-01-01: 100 mg via INTRAVENOUS
  Filled 2019-12-30 (×3): qty 50

## 2019-12-30 NOTE — Progress Notes (Signed)
Upon entering room, RN noticed pt has removed nasal cannula.  O2 sat >92% No acute distress noted.  RN will continue to monitor.  Ulis Rias, RN 12/30/2019 1:18 PM

## 2019-12-30 NOTE — Progress Notes (Signed)
Patient complaining about IVs not working. RN tried to flush both unsuccessfully. He agreed to allow placement of new one. However, he became irritated and uncooperative with this RN after tourniquet was placed and his arm hair got trapped in it; at which time he refused placement. At this time he has no IV fluids or IV medications ordered. IVs are still in place. Will attempt to remove and replace IV's at a later time.

## 2019-12-30 NOTE — Progress Notes (Addendum)
PROGRESS NOTE    [name]  KDT:[id number] DOB: 08-Oct-1970 DOA: 12/19/2019 PCP: Patient, No Pcp Per     Brief Narrative:   49 y.o. BM PMHx DM TYpe II, HTN, obesity, unvaccinated against Covid   [name] to the emergency room with a several day history of fever cough and shortness of breath.  His wife has similar but milder symptoms.  Due to progressing symptoms came into the emergency room for evaluation ED Course: On arrival he was febrile at 102.2 tachypneic at 22 with O2 sat 97% on room air but desaturating to 86 with ambulation.  BP 183/77.  Covid test positive, chest x-ray showed patchy bilateral airspace disease compatible with Covid pneumonia.  Patient started on usual treatment, oxygen.  Hospitalist consulted for admission.  Past medical history of morbid obesity. Patient does not take any medications for diabetes or hypertension. [name] with shortness of breath and found to have COVID-19 pneumonia as well as hypertensive urgency. Currently plan is continue current care for hypoxia.   Subjective: 9/4 afebrile overnight A/O x4, positive S OB but significantly improved.  Negative CP, negative abdominal pain.   Assessment & Plan: Covid vaccination; not vaccinated   Principal Problem:   Pneumonia due to COVID-19 virus Active Problems:   Hypertension   Diabetes mellitus without complication (HCC)   Acute respiratory failure due to COVID-19 (HCC)   Obesity, Class III, BMI 40-49.9 (morbid obesity) (HCC)   Acute respiratory failure with hypoxia/Covid pneumonia COVID-19 Labs  Recent Labs    12/28/19 0635 12/29/19 0618 12/30/19 0659  FERRITIN 1,151* 914* 1,051*  LDH 575* 552* 499*  CRP 5.7* 7.2*  --    Results for Scott, [name] (MRN [id number]) as of 12/29/2019 08:00  Ref. Range 12/25/2019 06:11 12/26/2019 10:17 12/27/2019 06:24 12/28/2019 06:35 12/29/2019 06:18  Fibrin derivatives D-dimer ([facility]) Latest Ref Range: 0.00 - 499.00 ng/mL (FEU) 4,336.52 (H) 3,023.69 (H)  4,909.14 (H) 3,560.92 (H) >7,500.00 (H)   Lab Results  Component Value Date   SARSCOV2NAA POSITIVE (A) 12/19/2019  -Remdesivir complete -8/24 Baricitinib for 14-day course -9/2 DC Decadron; Solu-Medrol 60 mg BID -Vitamin C and zinc per Covid protocol -Albuterol QID  Pneumoperitoneum. -Pulmonary consulted for pneumoperitoneum.  Currently conservative measures with close monitoring.  No incentive spirometry. -The investigational nature of this medication was discussed with the patient/HCPOA and they choose to proceed as the potential benefits are felt to outweigh risks at this time.  -Next week will obtain repeat CT scan.  At that point will discuss treatment options with critical care/surgery.  If not reabsorbing may need to become more aggressive -9/7 repeat CTA PE protocol pending, reevaluate progression of pneumoperitoneum.  HTN urgency -9/2 increase amlodipine 10 mg daily -Labetalol PRN -Spironolactone 25 mg daily -HCTZ 25 mg daily (hold)  Hyponatremia Lab Results  Component Value Date   NA 131 (L) 12/30/2019   NA 130 (L) 12/29/2019   NA 130 (L) 12/28/2019   NA 132 (L) 12/27/2019   NA 133 (L) 12/26/2019  -We will hold HCTZ  Hypokalemia -Potassium goal> 4  DM type II uncontrolled with hyperglycemia -8/25 hemoglobin A1c= 10.4 -Levemir 26 units BID -NovoLog 10 units `qac -Resistant SSI -Tradjenta 5 mg daily  HLD -9/2 lipid panel pending   Elevated LFT -Mildly elevated  Leukocytosis -Negative left shift negative bands most likely secondary to steroids  Obesity(40.23 kg/m.)    Oral ulcer -Patient has seen urgent care for the same. -Continuing chlorhexidine and Magic mouthwash with lidocaine. -Anticipating poor wound healing  in the setting of patient being on steroid as well as baricitinib -Per EMR discussed with ENT on-call, Outpatient ENT follow-up recommended.  Thrush oral/esophageal -9/4 fluconazole IV 200 mg x 1 -9/5 fluconazole IV 100 mg daily x6  days  Elevated D-dimer. -D-dimer is trending upwards despite improvement in other inflammatory markers. -At present patient does not have any new hypotension or worsening hypoxia or tachycardia. -CT PE protocol is also negative for pulmonary bleeding.  Oral thrush. -Nystatin Added.  Epistaxis. -Afrin spray scheduled. Also Ocean Spray. Currently resolved.  Obese (BMI 39.9 kg/m) -During acute illness no place for nutritional consultation.   DVT prophylaxis: Lovenox Code Status: Full Family Communication:  Status is: Inpatient    Dispo: The patient is from: Home              Anticipated d/c is to: Home              Anticipated d/c date is:??              Patient currently extremely unstable      Consultants:  PCCM  Procedures/Significant Events:  8/30 CTA chest PE protocol;No evidence of pulmonary embolus. -Extensive pneumomediastinum dissecting into the neck, RIGHT>> LEFT. -Bilateral patchy ground-glass opacities and airspace disease throughout the lungs consistent with patient's known history of COVID-19 pneumonia.  I have personally reviewed and interpreted all radiology studies and my findings are as above.  VENTILATOR SETTINGS: Nasal cannula 9/4 Flow; 3 L/min SPO2; 94%    Cultures 8/24 SARS coronavirus positive    Antimicrobials: Anti-infectives (From admission, onward)   Start     Ordered Stop   12/21/19 1000  remdesivir 100 mg in sodium chloride 0.9 % 100 mL IVPB       "Followed by" Linked Group Details   12/20/19 0051 12/24/19 1038   12/21/19 1000  remdesivir 100 mg in sodium chloride 0.9 % 100 mL IVPB  Status:  Discontinued       "Followed by" Linked Group Details   12/20/19 0157 12/20/19 0201   12/20/19 0200  remdesivir 200 mg in sodium chloride 0.9% 250 mL IVPB  Status:  Discontinued       "Followed by" Linked Group Details   12/20/19 0157 12/20/19 0201   12/20/19 0100  remdesivir 200 mg in sodium chloride 0.9% 250 mL IVPB        "Followed by" Linked Group Details   12/20/19 0051 12/20/19 0320       Devices    LINES / TUBES:      Continuous Infusions:   Objective: Vitals:   12/29/19 2000 12/29/19 2001 12/29/19 2006 12/30/19 0511  BP:   133/74 122/70  Pulse: 92 92 99   Resp:    (!) 27  Temp:   99 F (37.2 C) 99.3 F (37.4 C)  TempSrc:   Oral Oral  SpO2: 96% 96% 95%   Weight:    132.6 kg  Height:        Intake/Output Summary (Last 24 hours) at 12/30/2019 0942 Last data filed at 12/30/2019 0300 Gross per 24 hour  Intake 240 ml  Output 2600 ml  Net -2360 ml   Filed Weights   12/26/19 0500 12/27/19 0505 12/30/19 0511  Weight: 134.5 kg 133.7 kg 132.6 kg   Physical Exam:  General: A/O x4, positive  acute respiratory distress Eyes: negative scleral hemorrhage, negative anisocoria, negative icterus ENT: Negative Runny nose, negative gingival bleeding, Neck:  Negative scars, masses, torticollis, lymphadenopathy, JVD Lungs: extremely poor  breath sounds bilaterally without wheezes or crackles Cardiovascular: Regular rate and rhythm without murmur gallop or rub normal S1 and S2 Abdomen: OBESE, negative abdominal pain, nondistended, positive soft, bowel sounds, no rebound, no ascites, no appreciable mass Extremities: No significant cyanosis, clubbing, or edema bilateral lower extremities Skin: Negative rashes, lesions, ulcers Psychiatric:  Negative depression, negative anxiety, negative fatigue, negative mania  Central nervous system:  Cranial nerves II through XII intact, tongue/uvula midline, all extremities muscle strength 5/5, sensation intact throughout, negative dysarthria, negative expressive aphasia, negative receptive aphasia.  .     Data Reviewed: Care during the described time interval was provided by me .  I have reviewed this patient's available data, including medical history, events of note, physical examination, and all test results as part of my evaluation.  CBC: Recent Labs   Lab 12/26/19 1017 12/27/19 0624 12/28/19 0635 12/29/19 0618 12/30/19 0659  WBC 10.5 10.7* 14.5* 12.2* 12.3*  NEUTROABS 9.2* 9.4* 12.7* 10.5* 9.3*  HGB 15.2 14.4 14.2 14.6 15.1  HCT 45.3 42.6 41.5 43.3 43.9  MCV 82.1 82.4 79.7* 82.0 81.1  PLT 310 286 334 298 310   Basic Metabolic Panel: Recent Labs  Lab 12/26/19 1017 12/27/19 0624 12/27/19 0945 12/28/19 0635 12/29/19 0618 12/30/19 0659  NA 133* 132*  --  130* 130* 131*  K 4.2 4.5  --  4.2 4.1 3.6  CL 95* 96*  --  94* 95* 93*  CO2 28 26  --  26 24 26   GLUCOSE 226* 215*  --  301* 186* 112*  BUN 27* 21*  --  27* 23* 20  CREATININE 1.00 0.85  --  1.12 0.98 1.02  CALCIUM 8.5* 8.5*  --  8.6* 8.9 8.6*  MG 2.3 2.3  --  2.3 2.1 2.0  PHOS  --   --  3.7 3.4 4.5 4.0   GFR: Estimated Creatinine Clearance: 126.1 mL/min (by C-G formula based on SCr of 1.02 mg/dL). Liver Function Tests: Recent Labs  Lab 12/26/19 1017 12/27/19 0624 12/28/19 0635 12/29/19 0618 12/30/19 0659  AST 53* 42* 30 26 28   ALT 103* 91* 80* 70* 56*  ALKPHOS 71 58 67 57 54  BILITOT 1.0 1.2 0.9 0.9 0.9  PROT 6.9 6.6 6.9 7.3 7.4  ALBUMIN 3.2* 3.1* 3.0* 3.3* 3.1*   No results for input(s): LIPASE, AMYLASE in the last 168 hours. No results for input(s): AMMONIA in the last 168 hours. Coagulation Profile: No results for input(s): INR, PROTIME in the last 168 hours. Cardiac Enzymes: No results for input(s): CKTOTAL, CKMB, CKMBINDEX, TROPONINI in the last 168 hours. BNP (last 3 results) No results for input(s): PROBNP in the last 8760 hours. HbA1C: No results for input(s): HGBA1C in the last 72 hours. CBG: Recent Labs  Lab 12/29/19 0746 12/29/19 1141 12/29/19 1629 12/29/19 2005 12/30/19 0933  GLUCAP 178* 257* 111* 230* 173*   Lipid Profile: Recent Labs    12/29/19 0618  CHOL 189  HDL 48  LDLCALC 109*  TRIG 160*  CHOLHDL 3.9   Thyroid Function Tests: No results for input(s): TSH, T4TOTAL, FREET4, T3FREE, THYROIDAB in the last 72  hours. Anemia Panel: Recent Labs    12/29/19 0618 12/30/19 0659  FERRITIN 914* 1,051*   Sepsis Labs: No results for input(s): PROCALCITON, LATICACIDVEN in the last 168 hours.  Recent Results (from the past 240 hour(s))  MRSA PCR Screening     Status: None   Collection Time: 12/20/19  8:46 PM   Specimen: Nasal Mucosa; Nasopharyngeal  Result Value Ref Range Status   MRSA by PCR NEGATIVE NEGATIVE Final    Comment:        The GeneXpert MRSA Assay (FDA approved for NASAL specimens only), is one component of a comprehensive MRSA colonization surveillance program. It is not intended to diagnose MRSA infection nor to guide or monitor treatment for MRSA infections. Performed at [facility], [address] [place], [place] [address]          Radiology Studies: No results found.      Scheduled Meds: . albuterol  2 puff Inhalation Q6H  . amLODipine  10 mg Oral Daily  . vitamin C  500 mg Oral Daily  . baricitinib  4 mg Oral Daily  . chlorhexidine  15 mL Mouth/Throat BID  . cholecalciferol  1,000 Units Oral Daily  . furosemide  40 mg Intravenous Daily  . HYDROcodone-homatropine  5 mL Oral Q4H  . insulin aspart  0-20 Units Subcutaneous TID WC  . insulin aspart  0-5 Units Subcutaneous QHS  . insulin aspart  10 Units Subcutaneous TID WC  . insulin detemir  26 Units Subcutaneous BID  . linagliptin  5 mg Oral Daily  . melatonin  5 mg Oral QHS  . multivitamin with minerals  1 tablet Oral Daily  . nystatin  5 mL Oral QID  . oxymetazoline  1 spray Each Nare BID  . predniSONE  50 mg Oral Q breakfast  . Ensure Max Protein  11 oz Oral TID  . sodium chloride flush  10 mL Intravenous Q12H  . spironolactone  25 mg Oral Daily  . zinc sulfate  220 mg Oral Daily   Continuous Infusions:   LOS: 10 days    Time spent:40 min    Lismary Kiehn, Roselind Messier, MD [facility] Pager [phone]  If 7PM-7AM, please contact night-coverage [email or web address] Password  [facility] 12/30/2019, 9:42 AM

## 2019-12-30 NOTE — Progress Notes (Signed)
Pt mews yellow for respirations and heart rate by monitor.  RN counted respirations for one full minute.  Flow sheet corrected.  Elevated heart rate is not an acute change. RN will continue to assess.

## 2019-12-31 ENCOUNTER — Inpatient Hospital Stay: Payer: Commercial Managed Care - PPO

## 2019-12-31 ENCOUNTER — Encounter: Payer: Self-pay | Admitting: Internal Medicine

## 2019-12-31 DIAGNOSIS — D735 Infarction of spleen: Secondary | ICD-10-CM

## 2019-12-31 DIAGNOSIS — B3781 Candidal esophagitis: Secondary | ICD-10-CM

## 2019-12-31 DIAGNOSIS — I2699 Other pulmonary embolism without acute cor pulmonale: Secondary | ICD-10-CM | POA: Diagnosis present

## 2019-12-31 DIAGNOSIS — J22 Unspecified acute lower respiratory infection: Secondary | ICD-10-CM

## 2019-12-31 DIAGNOSIS — B370 Candidal stomatitis: Secondary | ICD-10-CM

## 2019-12-31 LAB — CBC WITH DIFFERENTIAL/PLATELET
Abs Immature Granulocytes: 0.32 10*3/uL — ABNORMAL HIGH (ref 0.00–0.07)
Basophils Absolute: 0 10*3/uL (ref 0.0–0.1)
Basophils Relative: 0 %
Eosinophils Absolute: 0.1 10*3/uL (ref 0.0–0.5)
Eosinophils Relative: 1 %
HCT: 42.1 % (ref 39.0–52.0)
Hemoglobin: 14.6 g/dL (ref 13.0–17.0)
Immature Granulocytes: 3 %
Lymphocytes Relative: 10 %
Lymphs Abs: 1.3 10*3/uL (ref 0.7–4.0)
MCH: 27.3 pg (ref 26.0–34.0)
MCHC: 34.7 g/dL (ref 30.0–36.0)
MCV: 78.8 fL — ABNORMAL LOW (ref 80.0–100.0)
Monocytes Absolute: 0.7 10*3/uL (ref 0.1–1.0)
Monocytes Relative: 6 %
Neutro Abs: 10.4 10*3/uL — ABNORMAL HIGH (ref 1.7–7.7)
Neutrophils Relative %: 80 %
Platelets: 311 10*3/uL (ref 150–400)
RBC: 5.34 MIL/uL (ref 4.22–5.81)
RDW: 14.7 % (ref 11.5–15.5)
WBC: 12.9 10*3/uL — ABNORMAL HIGH (ref 4.0–10.5)
nRBC: 0 % (ref 0.0–0.2)

## 2019-12-31 LAB — LACTATE DEHYDROGENASE: LDH: 424 U/L — ABNORMAL HIGH (ref 98–192)

## 2019-12-31 LAB — MAGNESIUM: Magnesium: 2.2 mg/dL (ref 1.7–2.4)

## 2019-12-31 LAB — COMPREHENSIVE METABOLIC PANEL
ALT: 51 U/L — ABNORMAL HIGH (ref 0–44)
AST: 22 U/L (ref 15–41)
Albumin: 2.9 g/dL — ABNORMAL LOW (ref 3.5–5.0)
Alkaline Phosphatase: 53 U/L (ref 38–126)
Anion gap: 12 (ref 5–15)
BUN: 29 mg/dL — ABNORMAL HIGH (ref 6–20)
CO2: 28 mmol/L (ref 22–32)
Calcium: 8.8 mg/dL — ABNORMAL LOW (ref 8.9–10.3)
Chloride: 91 mmol/L — ABNORMAL LOW (ref 98–111)
Creatinine, Ser: 1.08 mg/dL (ref 0.61–1.24)
GFR calc Af Amer: >60 mL/min (ref >60)
GFR calc non Af Amer: >60 mL/min (ref >60)
Glucose, Bld: 200 mg/dL — ABNORMAL HIGH (ref 70–99)
Potassium: 3.6 mmol/L (ref 3.5–5.1)
Sodium: 131 mmol/L — ABNORMAL LOW (ref 135–145)
Total Bilirubin: 0.7 mg/dL (ref 0.3–1.2)
Total Protein: 7.1 g/dL (ref 6.5–8.1)

## 2019-12-31 LAB — PHOSPHORUS: Phosphorus: 4.1 mg/dL (ref 2.5–4.6)

## 2019-12-31 LAB — FERRITIN: Ferritin: 1386 ng/mL — ABNORMAL HIGH (ref 24–336)

## 2019-12-31 LAB — GLUCOSE, CAPILLARY
Glucose-Capillary: 247 mg/dL — ABNORMAL HIGH (ref 70–99)
Glucose-Capillary: 270 mg/dL — ABNORMAL HIGH (ref 70–99)
Glucose-Capillary: 176 mg/dL — ABNORMAL HIGH (ref 70–99)
Glucose-Capillary: 169 mg/dL — ABNORMAL HIGH (ref 70–99)

## 2019-12-31 LAB — C-REACTIVE PROTEIN: CRP: 11.8 mg/dL — ABNORMAL HIGH (ref <1.0)

## 2019-12-31 LAB — FIBRIN DERIVATIVES D-DIMER (ARMC ONLY): Fibrin derivatives D-dimer (ARMC): >7500 ng/mL (FEU) — ABNORMAL HIGH (ref 0.00–499.00)

## 2019-12-31 MED ORDER — IOHEXOL 350 MG/ML SOLN
100.0000 mL | Freq: Once | INTRAVENOUS | Status: AC | PRN
  Administered 2019-12-31: 100 mL via INTRAVENOUS

## 2019-12-31 MED ORDER — ENOXAPARIN SODIUM 150 MG/ML ~~LOC~~ SOLN
130.0000 mg | Freq: Two times a day (BID) | SUBCUTANEOUS | Status: DC
  Administered 2019-12-31 – 2020-01-01 (×2): 130 mg via SUBCUTANEOUS
  Filled 2019-12-31 (×3): qty 0.86

## 2019-12-31 MED ORDER — INSULIN DETEMIR 100 UNIT/ML ~~LOC~~ SOLN
30.0000 [IU] | Freq: Two times a day (BID) | SUBCUTANEOUS | Status: DC
  Administered 2019-12-31 – 2020-01-03 (×6): 30 [IU] via SUBCUTANEOUS
  Filled 2019-12-31 (×8): qty 0.3

## 2019-12-31 MED ORDER — POTASSIUM CHLORIDE CRYS ER 20 MEQ PO TBCR
50.0000 meq | EXTENDED_RELEASE_TABLET | Freq: Once | ORAL | Status: AC
  Administered 2019-12-31: 50 meq via ORAL
  Filled 2019-12-31: qty 2

## 2019-12-31 NOTE — Progress Notes (Signed)
ANTICOAGULATION CONSULT NOTE  Pharmacy Consult for therapeutic Enoxaparin Indication: pulmonary embolus  No Known Allergies  Patient Measurements: Height: 6' (182.9 cm) Weight: 130.9 kg (288 lb 8 oz) IBW/kg (Calculated) : 77.6  Vital Signs: Temp: 98.2 F (36.8 C) (09/05 1214) Temp Source: Oral (09/05 1214) BP: 148/79 (09/05 1214) Pulse Rate: 88 (09/05 1214)  Labs: Recent Labs    12/29/19 0618 12/29/19 0618 12/30/19 0659 12/31/19 0500  HGB 14.6   < > 15.1 14.6  HCT 43.3  --  43.9 42.1  PLT 298  --  310 311  CREATININE 0.98  --  1.02 1.08   < > = values in this interval not displayed.    Estimated Creatinine Clearance: 118.3 mL/min (by C-G formula based on SCr of 1.08 mg/dL).   Medical History: Past Medical History:  Diagnosis Date   COVID-19 12/19/2019   Diagnosed at [facility] on 12/19/2019   Diabetes mellitus without complication (HCC)    History of tobacco use    Hyperlipidemia    Hypertension    Obesity     Medications:  Patient was not on anticoagulation prior to admission Previously had prophylactic lovenox 8/25>>9/1  Assessment: Pharmacy has been consulted for therapeutic lovenox dosing in a 49yo male with PE. Patient is positive for COVID-19 and chest CT consistent with COVID-19 PNA and showed small volume acute thromboembolic disease involving segmental branches in the left lower lobe. No central or right-sided pulmonary emboli. Hgb and Plt wnl.    Goal of Therapy:  Monitor platelets by anticoagulation protocol: Yes   Plan:  Will give Lovenox  130mg  Q12h Monitor CBC per protocol  , PharmD Pharmacy Resident  12/31/2019 2:53 PM

## 2019-12-31 NOTE — Progress Notes (Signed)
PROGRESS NOTE    [name]  HBZ:[id number] DOB: 1970-06-19 DOA: 12/19/2019 PCP: Patient, No Pcp Per     Brief Narrative:   49 y.o. BM PMHx DM TYpe II, HTN, obesity, unvaccinated against Covid   Presents to the emergency room with a several day history of fever cough and shortness of breath.  His wife has similar but milder symptoms.  Due to progressing symptoms came into the emergency room for evaluation ED Course: On arrival he was febrile at 102.2 tachypneic at 22 with O2 sat 97% on room air but desaturating to 86 with ambulation.  BP 183/77.  Covid test positive, chest x-ray showed patchy bilateral airspace disease compatible with Covid pneumonia.  Patient started on usual treatment, oxygen.  Hospitalist consulted for admission.  Past medical history of morbid obesity. Patient does not take any medications for diabetes or hypertension. Presents with shortness of breath and found to have COVID-19 pneumonia as well as hypertensive urgency. Currently plan is continue current care for hypoxia.   Subjective: 9/5 afebrile overnight A/O x4, positive S OB but continued improvement.  Negative CP.  Negative abdominal pain.   Assessment & Plan: Covid vaccination; not vaccinated   Principal Problem:   Pneumonia due to COVID-19 virus Active Problems:   Hypertension   Diabetes mellitus without complication (HCC)   Acute respiratory failure due to COVID-19 (HCC)   Obesity, Class III, BMI 40-49.9 (morbid obesity) (HCC)   Pulmonary embolus, left (HCC)   Splenic infarct   Acute respiratory failure with hypoxia/Covid pneumonia COVID-19 Labs  Recent Labs    12/29/19 0618 12/30/19 0659 12/31/19 0500  FERRITIN 914* 1,051* 1,386*  LDH 552* 499* 424*  CRP 7.2* 10.5* 11.8*   Results for [name], [name] (MRN [id number]) as of 12/29/2019 08:00  Ref. Range 12/25/2019 06:11 12/26/2019 10:17 12/27/2019 06:24 12/28/2019 06:35 12/29/2019 06:18  Fibrin derivatives D-dimer ([facility]) Latest Ref Range:  0.00 - 499.00 ng/mL (FEU) 4,336.52 (H) 3,023.69 (H) 4,909.14 (H) 3,560.92 (H) >7,500.00 (H)   Lab Results  Component Value Date   SARSCOV2NAA POSITIVE (A) 12/19/2019  -Remdesivir complete -8/24 Baricitinib for 14-day course -9/2 DC Decadron; Solu-Medrol 60 mg BID -Vitamin C and zinc per Covid protocol -Albuterol QID  Pneumoperitoneum. -Pulmonary consulted for pneumoperitoneum.  Currently conservative measures with close monitoring.  No incentive spirometry. -The investigational nature of this medication was discussed with the patient/HCPOA and they choose to proceed as the potential benefits are felt to outweigh risks at this time.  -Next week will obtain repeat CT scan.  At that point will discuss treatment options with critical care/surgery.  If not reabsorbing may need to become more aggressive -9/7 repeat CTA PE protocol pending, reevaluate progression of pneumoperitoneum. -9/5 CTA PE protocol pneumoperitoneum resolved see results below  ACUTE LEFT lower lobe PE  -9/5 increase Lovenox full dose per pharmacy  Splenic infarct  -Per CTA PE protocol 9/5 possible small splenic infarct see results below  HTN urgency -9/2 increase amlodipine 10 mg daily -Labetalol PRN -Spironolactone 25 mg daily -HCTZ 25 mg daily (hold)  Hyponatremia Lab Results  Component Value Date   NA 131 (L) 12/31/2019   NA 131 (L) 12/30/2019   NA 130 (L) 12/29/2019   NA 130 (L) 12/28/2019   NA 132 (L) 12/27/2019  -We will hold HCTZ  Hypokalemia -Potassium goal> 4 -9/5 K-Dur 50 mEq   DM type II uncontrolled with hyperglycemia -8/25 hemoglobin A1c= 10.4 -9/5 increase Levemir 30 units BID -NovoLog 10 units `qac -Resistant SSI -  Tradjenta 5 mg daily  HLD -9/3 l LDL= 109 -Would hold on restarting statin medication until completion of Covid medication.  Elevated LFT -Mildly elevated  Leukocytosis -Negative left shift negative bands most likely secondary to steroids  Obesity(40.23 kg/m.)     Oral ulcer -Patient has seen urgent care for the same. -Continuing chlorhexidine and Magic mouthwash with lidocaine. -Anticipating poor wound healing in the setting of patient being on steroid as well as baricitinib -Per EMR discussed with ENT on-call, Outpatient ENT follow-up recommended.  Thrush oral/esophageal -9/4 fluconazole IV 200 mg x 1 -9/5 fluconazole IV 100 mg daily x6 days  Oral thrush. -Nystatin Added.  Elevated D-dimer. -D-dimer is trending upwards despite improvement in other inflammatory markers. -At present patient does not have any new hypotension or worsening hypoxia or tachycardia. -CT PE protocol is also negative for pulmonary bleeding.   Epistaxis. -Afrin spray scheduled. Also Ocean Spray. Currently resolved.  Obese (BMI 39.9 kg/m) -During acute illness no place for nutritional consultation.   DVT prophylaxis: Lovenox Code Status: Full Family Communication:  Status is: Inpatient    Dispo: The patient is from: Home              Anticipated d/c is to: Home              Anticipated d/c date is:??              Patient currently extremely unstable      Consultants:  PCCM ENT  Procedures/Significant Events:  8/30 CTA chest PE protocol;No evidence of pulmonary embolus. -Extensive pneumomediastinum dissecting into the neck, RIGHT>> LEFT. -Bilateral patchy ground-glass opacities and airspace disease throughout the lungs consistent with patient's known history of COVID-19 pneumonia. 9/5 PCXR;Multifocal pneumonia in this patient with known COVID 9/5 CTA chest PE protocol;-small volume acute thromboembolic disease involving segmental branches in the left lower lobe. -Extensive ground-glass opacities throughout both lungs, similar to the previous study and consistent with known COVID-19 pneumonia.There is slightly increased consolidation within the lingula. -Resolution of previously demonstrated pneumomediastinum. No pneumothorax. -Possible  small splenic infarct.   I have personally reviewed and interpreted all radiology studies and my findings are as above.  VENTILATOR SETTINGS: Nasal cannula 9/5 Flow; 3 L/min SPO2; 98%    Cultures 8/24 SARS coronavirus positive    Antimicrobials: Anti-infectives (From admission, onward)   Start     Ordered Stop   12/21/19 1000  remdesivir 100 mg in sodium chloride 0.9 % 100 mL IVPB       "Followed by" Linked Group Details   12/20/19 0051 12/24/19 1038   12/21/19 1000  remdesivir 100 mg in sodium chloride 0.9 % 100 mL IVPB  Status:  Discontinued       "Followed by" Linked Group Details   12/20/19 0157 12/20/19 0201   12/20/19 0200  remdesivir 200 mg in sodium chloride 0.9% 250 mL IVPB  Status:  Discontinued       "Followed by" Linked Group Details   12/20/19 0157 12/20/19 0201   12/20/19 0100  remdesivir 200 mg in sodium chloride 0.9% 250 mL IVPB       "Followed by" Linked Group Details   12/20/19 0051 12/20/19 0320       Devices    LINES / TUBES:      Continuous Infusions: . fluconazole (DIFLUCAN) IV       Objective: Vitals:   12/30/19 2109 12/31/19 0357 12/31/19 0823 12/31/19 1214  BP: (!) 147/77  (!) 145/89 (!) 148/79  Pulse: Marland Kitchen)  101  73 88  Resp: 20  18 19   Temp: 98.5 F (36.9 C)  98 F (36.7 C) 98.2 F (36.8 C)  TempSrc: Oral  Oral Oral  SpO2:   98% 98%  Weight:  130.9 kg    Height:        Intake/Output Summary (Last 24 hours) at 12/31/2019 1456 Last data filed at 12/31/2019 1258 Gross per 24 hour  Intake 180 ml  Output 2825 ml  Net -2645 ml   Filed Weights   12/27/19 0505 12/30/19 0511 12/31/19 0357  Weight: 133.7 kg 132.6 kg 130.9 kg   Physical Exam:  General: A/O x4, positive acute respiratory distress Eyes: negative scleral hemorrhage, negative anisocoria, negative icterus ENT: Negative Runny nose, negative gingival bleeding, thrush visible extending toward soft palate Neck:  Negative scars, masses, torticollis, lymphadenopathy,  JVD Lungs: poor breath sounds bilaterally without wheezes or crackles Cardiovascular: Regular rate and rhythm without murmur gallop or rub normal S1 and S2 Abdomen: OBESE, negative abdominal pain, nondistended, positive soft, bowel sounds, no rebound, no ascites, no appreciable mass Extremities: No significant cyanosis, clubbing, or edema bilateral lower extremities Skin: Negative rashes, lesions, ulcers Psychiatric:  Negative depression, negative anxiety, negative fatigue, negative mania  Central nervous system:  Cranial nerves II through XII intact, tongue/uvula midline, all extremities muscle strength 5/5, sensation intact throughout, negative dysarthria, negative expressive aphasia, negative receptive aphasia.   .     Data Reviewed: Care during the described time interval was provided by me .  I have reviewed this patient's available data, including medical history, events of note, physical examination, and all test results as part of my evaluation.  CBC: Recent Labs  Lab 12/27/19 0624 12/28/19 0635 12/29/19 0618 12/30/19 0659 12/31/19 0500  WBC 10.7* 14.5* 12.2* 12.3* 12.9*  NEUTROABS 9.4* 12.7* 10.5* 9.3* 10.4*  HGB 14.4 14.2 14.6 15.1 14.6  HCT 42.6 41.5 43.3 43.9 42.1  MCV 82.4 79.7* 82.0 81.1 78.8*  PLT 286 334 298 310 311   Basic Metabolic Panel: Recent Labs  Lab 12/27/19 0624 12/27/19 0945 12/28/19 0635 12/29/19 0618 12/30/19 0659 12/31/19 0500  NA 132*  --  130* 130* 131* 131*  K 4.5  --  4.2 4.1 3.6 3.6  CL 96*  --  94* 95* 93* 91*  CO2 26  --  26 24 26 28   GLUCOSE 215*  --  301* 186* 112* 200*  BUN 21*  --  27* 23* 20 29*  CREATININE 0.85  --  1.12 0.98 1.02 1.08  CALCIUM 8.5*  --  8.6* 8.9 8.6* 8.8*  MG 2.3  --  2.3 2.1 2.0 2.2  PHOS  --  3.7 3.4 4.5 4.0 4.1   GFR: Estimated Creatinine Clearance: 118.3 mL/min (by C-G formula based on SCr of 1.08 mg/dL). Liver Function Tests: Recent Labs  Lab 12/27/19 0624 12/28/19 0635 12/29/19 0618  12/30/19 0659 12/31/19 0500  AST 42* 30 26 28 22   ALT 91* 80* 70* 56* 51*  ALKPHOS 58 67 57 54 53  BILITOT 1.2 0.9 0.9 0.9 0.7  PROT 6.6 6.9 7.3 7.4 7.1  ALBUMIN 3.1* 3.0* 3.3* 3.1* 2.9*   No results for input(s): LIPASE, AMYLASE in the last 168 hours. No results for input(s): AMMONIA in the last 168 hours. Coagulation Profile: No results for input(s): INR, PROTIME in the last 168 hours. Cardiac Enzymes: No results for input(s): CKTOTAL, CKMB, CKMBINDEX, TROPONINI in the last 168 hours. BNP (last 3 results) No results for input(s): PROBNP  in the last 8760 hours. HbA1C: No results for input(s): HGBA1C in the last 72 hours. CBG: Recent Labs  Lab 12/30/19 1213 12/30/19 1710 12/30/19 2030 12/31/19 0819 12/31/19 1213  GLUCAP 199* 248* 311* 247* 270*   Lipid Profile: Recent Labs    12/29/19 0618  CHOL 189  HDL 48  LDLCALC 109*  TRIG 160*  CHOLHDL 3.9   Thyroid Function Tests: No results for input(s): TSH, T4TOTAL, FREET4, T3FREE, THYROIDAB in the last 72 hours. Anemia Panel: Recent Labs    12/30/19 0659 12/31/19 0500  FERRITIN 1,051* 1,386*   Sepsis Labs: No results for input(s): PROCALCITON, LATICACIDVEN in the last 168 hours.  No results found for this or any previous visit (from the past 240 hour(s)).       Radiology Studies: CT ANGIO CHEST PE W OR WO CONTRAST  Addendum Date: 12/31/2019   ADDENDUM REPORT: 12/31/2019 13:57 ADDENDUM: Critical Value/emergent results were called by telephone at the time of interpretation on 12/31/2019 at 1:55 pm to provider Jennife Zaucha , who verbally acknowledged these results. Electronically Signed   By: Carey Bullocks M.D.   On: 12/31/2019 13:57   Result Date: 12/31/2019 CLINICAL DATA:  COVID-19 infection with progressive shortness of breath. Evaluate for pulmonary embolism. EXAM: CT ANGIOGRAPHY CHEST WITH CONTRAST TECHNIQUE: Multidetector CT imaging of the chest was performed using the standard protocol during bolus  administration of intravenous contrast. Multiplanar CT image reconstructions and MIP's were obtained to evaluate the vascular anatomy. CONTRAST:  OMNIPAQUE IOHEXOL 350 MG/ML SOLN COMPARISON:  Radiographs today and 12/19/2019. Chest CTA 12/25/2019. FINDINGS: Cardiovascular: The pulmonary arteries are well opacified with contrast to the level of the subsegmental branches. There is small volume acute thromboembolic disease involving segmental branches in the left lower lobe, best seen on coronal images 63 through 66 of series 7. No central or right-sided pulmonary emboli are seen. No significant systemic arterial abnormalities. The heart size is normal. There is no pericardial effusion. Mediastinum/Nodes: There are no enlarged mediastinal, hilar or axillary lymph nodes.Previously demonstrated pneumomediastinum has resolved. The thyroid gland, trachea and esophagus demonstrate no significant findings. Lungs/Pleura: There is no pleural effusion or pneumothorax. Extensive ground-glass opacities throughout both lungs are again noted, similar to the previous study and consistent with viral pneumonia. There is slightly increased consolidation within the lingula. Upper abdomen: Decreased hepatic density suspicious for steatosis. There is wedge-shaped low-density within the spleen which may reflect a small splenic infarct. The visualized adrenal glands are unremarkable. Musculoskeletal/Chest wall: There is no chest wall mass or suspicious osseous finding. Review of the MIP images confirms the above findings. IMPRESSION: 1. Small volume acute thromboembolic disease involving segmental branches in the left lower lobe. No central or right-sided pulmonary emboli. 2. Extensive ground-glass opacities throughout both lungs, similar to the previous study and consistent with known COVID-19 pneumonia. There is slightly increased consolidation within the lingula. 3. Resolution of previously demonstrated pneumomediastinum. No  pneumothorax. 4. Possible small splenic infarct. Electronically Signed: By: Carey Bullocks M.D. On: 12/31/2019 13:46   DG Chest Port 1 View  Result Date: 12/31/2019 CLINICAL DATA:  Fever, cough, shortness of breath, COVID positive EXAM: PORTABLE CHEST 1 VIEW COMPARISON:  CT chest dated 12/25/2019 FINDINGS: Multifocal patchy opacities in the lungs bilaterally. No pleural effusion or pneumothorax. Heart is normal in size. IMPRESSION: Multifocal pneumonia in this patient with known COVID. Electronically Signed   By: Charline Bills M.D.   On: 12/31/2019 07:39        Scheduled Meds: .  albuterol  2 puff Inhalation Q6H  . amLODipine  10 mg Oral Daily  . vitamin C  500 mg Oral Daily  . baricitinib  4 mg Oral Daily  . chlorhexidine  15 mL Mouth/Throat BID  . cholecalciferol  1,000 Units Oral Daily  . enoxaparin (LOVENOX) injection  130 mg Subcutaneous Q12H  . furosemide  40 mg Intravenous Daily  . HYDROcodone-homatropine  5 mL Oral Q4H  . insulin aspart  0-20 Units Subcutaneous TID WC  . insulin aspart  0-5 Units Subcutaneous QHS  . insulin aspart  10 Units Subcutaneous TID WC  . insulin detemir  30 Units Subcutaneous BID  . linagliptin  5 mg Oral Daily  . melatonin  5 mg Oral QHS  . multivitamin with minerals  1 tablet Oral Daily  . nystatin  5 mL Oral QID  . oxymetazoline  1 spray Each Nare BID  . potassium chloride  50 mEq Oral Once  . predniSONE  50 mg Oral Q breakfast  . Ensure Max Protein  11 oz Oral TID  . sodium chloride flush  10 mL Intravenous Q12H  . spironolactone  25 mg Oral Daily  . zinc sulfate  220 mg Oral Daily   Continuous Infusions: . fluconazole (DIFLUCAN) IV       LOS: 11 days    Time spent:40 min    Ernestene Coover, Roselind Messier, MD [facility] Pager [phone]  If 7PM-7AM, please contact night-coverage [email or web address] Password [email or web address] 12/31/2019, 2:56 PM

## 2020-01-01 LAB — CBC WITH DIFFERENTIAL/PLATELET
Abs Immature Granulocytes: 0.19 10*3/uL — ABNORMAL HIGH (ref 0.00–0.07)
Basophils Absolute: 0 10*3/uL (ref 0.0–0.1)
Basophils Relative: 0 %
Eosinophils Absolute: 0 10*3/uL (ref 0.0–0.5)
Eosinophils Relative: 0 %
HCT: 40.5 % (ref 39.0–52.0)
Hemoglobin: 14.1 g/dL (ref 13.0–17.0)
Immature Granulocytes: 2 %
Lymphocytes Relative: 17 %
Lymphs Abs: 2 10*3/uL (ref 0.7–4.0)
MCH: 27.8 pg (ref 26.0–34.0)
MCHC: 34.8 g/dL (ref 30.0–36.0)
MCV: 79.9 fL — ABNORMAL LOW (ref 80.0–100.0)
Monocytes Absolute: 0.8 10*3/uL (ref 0.1–1.0)
Monocytes Relative: 7 %
Neutro Abs: 8.8 10*3/uL — ABNORMAL HIGH (ref 1.7–7.7)
Neutrophils Relative %: 74 %
Platelets: 364 10*3/uL (ref 150–400)
RBC: 5.07 MIL/uL (ref 4.22–5.81)
RDW: 15 % (ref 11.5–15.5)
WBC: 11.9 10*3/uL — ABNORMAL HIGH (ref 4.0–10.5)
nRBC: 0 % (ref 0.0–0.2)

## 2020-01-01 LAB — LACTATE DEHYDROGENASE: LDH: 388 U/L — ABNORMAL HIGH (ref 98–192)

## 2020-01-01 LAB — COMPREHENSIVE METABOLIC PANEL
ALT: 45 U/L — ABNORMAL HIGH (ref 0–44)
AST: 19 U/L (ref 15–41)
Albumin: 3.1 g/dL — ABNORMAL LOW (ref 3.5–5.0)
Alkaline Phosphatase: 48 U/L (ref 38–126)
Anion gap: 9 (ref 5–15)
BUN: 25 mg/dL — ABNORMAL HIGH (ref 6–20)
CO2: 28 mmol/L (ref 22–32)
Calcium: 8.7 mg/dL — ABNORMAL LOW (ref 8.9–10.3)
Chloride: 98 mmol/L (ref 98–111)
Creatinine, Ser: 1.02 mg/dL (ref 0.61–1.24)
GFR calc Af Amer: >60 mL/min (ref >60)
GFR calc non Af Amer: >60 mL/min (ref >60)
Glucose, Bld: 96 mg/dL (ref 70–99)
Potassium: 3.9 mmol/L (ref 3.5–5.1)
Sodium: 135 mmol/L (ref 135–145)
Total Bilirubin: 0.7 mg/dL (ref 0.3–1.2)
Total Protein: 6.9 g/dL (ref 6.5–8.1)

## 2020-01-01 LAB — PHOSPHORUS: Phosphorus: 4.1 mg/dL (ref 2.5–4.6)

## 2020-01-01 LAB — FIBRIN DERIVATIVES D-DIMER (ARMC ONLY): Fibrin derivatives D-dimer (ARMC): 5641.2 ng/mL (FEU) — ABNORMAL HIGH (ref 0.00–499.00)

## 2020-01-01 LAB — GLUCOSE, CAPILLARY
Glucose-Capillary: 100 mg/dL — ABNORMAL HIGH (ref 70–99)
Glucose-Capillary: 227 mg/dL — ABNORMAL HIGH (ref 70–99)
Glucose-Capillary: 196 mg/dL — ABNORMAL HIGH (ref 70–99)

## 2020-01-01 LAB — MAGNESIUM: Magnesium: 2.5 mg/dL — ABNORMAL HIGH (ref 1.7–2.4)

## 2020-01-01 LAB — FERRITIN: Ferritin: 1119 ng/mL — ABNORMAL HIGH (ref 24–336)

## 2020-01-01 LAB — C-REACTIVE PROTEIN: CRP: 3.3 mg/dL — ABNORMAL HIGH (ref <1.0)

## 2020-01-01 MED ORDER — APIXABAN 5 MG PO TABS
10.0000 mg | ORAL_TABLET | Freq: Two times a day (BID) | ORAL | Status: DC
  Administered 2020-01-01 – 2020-01-03 (×4): 10 mg via ORAL
  Filled 2020-01-01: qty 4
  Filled 2020-01-01 (×3): qty 2

## 2020-01-01 MED ORDER — METOPROLOL TARTRATE 25 MG PO TABS
12.5000 mg | ORAL_TABLET | Freq: Two times a day (BID) | ORAL | Status: DC
  Administered 2020-01-01 – 2020-01-03 (×5): 12.5 mg via ORAL
  Filled 2020-01-01 (×5): qty 1

## 2020-01-01 MED ORDER — SALINE SPRAY 0.65 % NA SOLN: 1.0000 | NASAL | Status: DC | PRN

## 2020-01-01 MED ORDER — DOCUSATE SODIUM 100 MG PO CAPS
100.0000 mg | ORAL_CAPSULE | Freq: Two times a day (BID) | ORAL | Status: DC
  Administered 2020-01-01 – 2020-01-03 (×6): 100 mg via ORAL
  Filled 2020-01-01 (×6): qty 1

## 2020-01-01 MED ORDER — APIXABAN 5 MG PO TABS: 5.0000 mg | ORAL_TABLET | Freq: Two times a day (BID) | ORAL | Status: DC

## 2020-01-01 NOTE — Progress Notes (Signed)
SATURATION QUALIFICATIONS: (This note is used to comply with regulatory documentation for home oxygen)  Patient Saturations on Room Air at Rest = 88%  Patient Saturations on Room Air while Ambulating = 80%  Patient Saturations on 3 Liters of oxygen while Ambulating = 93%  Please briefly explain why patient needs home oxygen: Patient's oxygen saturation dropped below 88% with minimal exertion on room air.

## 2020-01-01 NOTE — Progress Notes (Signed)
PROGRESS NOTE    [name]  JXB:[id number] DOB: [birth year] DOA: 12/19/2019 PCP: Patient, No Pcp Per     Brief Narrative:   49 y.o. BM PMHx DM TYpe II, HTN, obesity, unvaccinated against Covid   Presents to the emergency room with a several day history of fever cough and shortness of breath.  His wife has similar but milder symptoms.  Due to progressing symptoms came into the emergency room for evaluation ED Course: On arrival he was febrile at 102.2 tachypneic at 22 with O2 sat 97% on room air but desaturating to 86 with ambulation.  BP 183/77.  Covid test positive, chest x-ray showed patchy bilateral airspace disease compatible with Covid pneumonia.  Patient started on usual treatment, oxygen.  Hospitalist consulted for admission.  Past medical history of morbid obesity. Patient does not take any medications for diabetes or hypertension. Presents with shortness of breath and found to have COVID-19 pneumonia as well as hypertensive urgency. Currently plan is continue current care for hypoxia.   Subjective: 9/6    afebrile overnight A/O x4, positive S OB but continued improvement.  Negative CP.  Negative abdominal pain.   Assessment & Plan: Covid vaccination; not vaccinated   Principal Problem:   Pneumonia due to COVID-19 virus Active Problems:   Hypertension   Diabetes mellitus without complication (HCC)   Acute respiratory failure due to COVID-19 (HCC)   Obesity, Class III, BMI 40-49.9 (morbid obesity) (HCC)   Pulmonary embolus, left (HCC)   Splenic infarct   Acute respiratory failure with hypoxia/Covid pneumonia COVID-19 Labs  Recent Labs    12/30/19 0659 12/31/19 0500 01/01/20 0810  FERRITIN 1,051* 1,386* 1,119*  LDH 499* 424* 388*  CRP 10.5* 11.8* 3.3*   Results for TERRE, ZABRISKIE (MRN [id number]) as of 12/29/2019 08:00  Ref. Range 12/25/2019 06:11 12/26/2019 10:17 12/27/2019 06:24 12/28/2019 06:35 12/29/2019 06:18  Fibrin derivatives D-dimer ([facility]) Latest Ref  Range: 0.00 - 499.00 ng/mL (FEU) 4,336.52 (H) 3,023.69 (H) 4,909.14 (H) 3,560.92 (H) >7,500.00 (H)   Lab Results  Component Value Date   SARSCOV2NAA POSITIVE (A) 12/19/2019  -Remdesivir complete -8/24 Baricitinib for 14-day course -9/2 DC Decadron; Solu-Medrol 60 mg BID -Vitamin C and zinc per Covid protocol -Albuterol QID SATURATION QUALIFICATIONS: (This note is used to comply with regulatory documentation for home oxygen) Patient Saturations on Room Air at Rest = 88% Patient Saturations on [employer] while Ambulating = 80% Patient Saturations on 3 Liters of oxygen while Ambulating = 93% Please briefly explain why patient needs home oxygen: Patient's oxygen saturation dropped below 88% with minimal exertion on room air. -Patient meets criteria for home O2 -3 L O2; titrate to maintain SPO2> 88% -Provide [employer] home portable O2 concentrator  Pneumoperitoneum. -Pulmonary consulted for pneumoperitoneum.  Currently conservative measures with close monitoring.  No incentive spirometry. -The investigational nature of this medication was discussed with the patient/HCPOA and they choose to proceed as the potential benefits are felt to outweigh risks at this time.  -Next week will obtain repeat CT scan.  At that point will discuss treatment options with critical care/surgery.  If not reabsorbing may need to become more aggressive -9/7 repeat CTA PE protocol pending, reevaluate progression of pneumoperitoneum. -9/5 CTA PE protocol pneumoperitoneum resolved see results below  ACUTE LEFT lower lobe PE  -9/5 increase Lovenox full dose per pharmacy -9/6 start Eliquis per pharmacy  -9/6 discussed case with Dr. Chilton Si Aleskerovpulmonologist who has arranged for home O2 for patient. -9/6 schedule follow-up appointment with  Dr. Chilton Si Aleskerovpulmonologist in 2 weeks pneumoperitoneum, left lower lobe PE  Splenic infarct  -Per CTA PE protocol 9/5 possible small splenic infarct see results below  HTN  urgency -9/2 increase amlodipine 10 mg daily -9/6 metoprolol 12.5 mg BID -Labetalol PRN -Spironolactone 25 mg daily -HCTZ 25 mg daily (hold) will not restart at discharge secondary to hyponatremia -9/6 spoke with [name] she will arrange for patient to have follow-up with physician HTN urgency, uncontrolled diabetes, HLD.  Hyponatremia Lab Results  Component Value Date   NA 135 01/01/2020   NA 131 (L) 12/31/2019   NA 131 (L) 12/30/2019   NA 130 (L) 12/29/2019   NA 130 (L) 12/28/2019  -We will hold HCTZ -9/6 has resolved would not restart HCTZ  Hypokalemia -Potassium goal> 4  DM type II uncontrolled with hyperglycemia -8/25 hemoglobin A1c= 10.4 -9/5 increase Levemir 30 units BID -NovoLog 10 units `qac -Resistant SSI -Tradjenta 5 mg daily  HLD -9/3 l LDL= 109 -Would hold on restarting statin medication until completion of Covid medication.  Elevated LFT -Mildly elevated  Leukocytosis -Negative left shift negative bands most likely secondary to steroids  Obesity(40.23 kg/m.)    Oral ulcer -Patient has seen urgent care for the same. -Continuing chlorhexidine and Magic mouthwash with lidocaine. -Anticipating poor wound healing in the setting of patient being on steroid as well as baricitinib -Per EMR discussed with ENT on-call, Outpatient ENT follow-up recommended.  Thrush oral/esophageal -9/4 fluconazole IV 200 mg x 1 -9/5 fluconazole IV 100 mg daily x6 days  Oral thrush. -Nystatin Added.  Elevated D-dimer. -D-dimer is trending upwards despite improvement in other inflammatory markers. -At present patient does not have any new hypotension or worsening hypoxia or tachycardia. -CT PE protocol is also negative for pulmonary bleeding. -9/5 repeat CTA PE protocol positive for acute PE see above  Epistaxis. -Afrin spray scheduled. Also Ocean Spray. Currently resolved.  Obese (BMI 39.9 kg/m) -During acute illness no place for nutritional  consultation.   DVT prophylaxis: Lovenox---> Eliquis Code Status: Full Family Communication:  Status is: Inpatient    Dispo: The patient is from: Home              Anticipated d/c is to: Home              Anticipated d/c date is: 9/7              Patient currently stable      Consultants:  PCCM ENT  Procedures/Significant Events:  8/30 CTA chest PE protocol;No evidence of pulmonary embolus. -Extensive pneumomediastinum dissecting into the neck, RIGHT>> LEFT. -Bilateral patchy ground-glass opacities and airspace disease throughout the lungs consistent with patient's known history of COVID-19 pneumonia. 9/5 PCXR;Multifocal pneumonia in this patient with known COVID 9/5 CTA chest PE protocol;-small volume acute thromboembolic disease involving segmental branches in the left lower lobe. -Extensive ground-glass opacities throughout both lungs, similar to the previous study and consistent with known COVID-19 pneumonia.There is slightly increased consolidation within the lingula. -Resolution of previously demonstrated pneumomediastinum. No pneumothorax. -Possible small splenic infarct.   I have personally reviewed and interpreted all radiology studies and my findings are as above.  VENTILATOR SETTINGS: Nasal cannula 9/6 Flow; 3.5 L/min SPO2; 100%    Cultures 8/24 SARS coronavirus positive    Antimicrobials: Anti-infectives (From admission, onward)   Start     Ordered Stop   12/21/19 1000  remdesivir 100 mg in sodium chloride 0.9 % 100 mL IVPB       "  Followed by" Linked Group Details   12/20/19 0051 12/24/19 1038   12/21/19 1000  remdesivir 100 mg in sodium chloride 0.9 % 100 mL IVPB  Status:  Discontinued       "Followed by" Linked Group Details   12/20/19 0157 12/20/19 0201   12/20/19 0200  remdesivir 200 mg in sodium chloride 0.9% 250 mL IVPB  Status:  Discontinued       "Followed by" Linked Group Details   12/20/19 0157 12/20/19 0201   12/20/19 0100   remdesivir 200 mg in sodium chloride 0.9% 250 mL IVPB       "Followed by" Linked Group Details   12/20/19 0051 12/20/19 0320       Devices    LINES / TUBES:      Continuous Infusions: . fluconazole (DIFLUCAN) IV 50 mL/hr at 12/31/19 2121     Objective: Vitals:   01/01/20 0450 01/01/20 0736 01/01/20 1137 01/01/20 1429  BP: (!) 152/89 (!) 147/84 (!) 158/85   Pulse: 79 78 93   Resp: (!) 22 19 20    Temp: 97.8 F (36.6 C) 98 F (36.7 C) 97.8 F (36.6 C)   TempSrc: Oral Oral Oral   SpO2: 100% 96% 97% (!) 80%  Weight: 132.2 kg     Height:        Intake/Output Summary (Last 24 hours) at 01/01/2020 1514 Last data filed at 01/01/2020 1355 Gross per 24 hour  Intake --  Output 3425 ml  Net -3425 ml   Filed Weights   12/30/19 0511 12/31/19 0357 01/01/20 0450  Weight: 132.6 kg 130.9 kg 132.2 kg   Physical Exam:  General: A/O x4, positive acute respiratory distress Eyes: negative scleral hemorrhage, negative anisocoria, negative icterus ENT: Negative Runny nose, negative gingival bleeding, Neck:  Negative scars, masses, torticollis, lymphadenopathy, JVD Lungs: poor breath sounds bilaterally without wheezes or crackles Cardiovascular: Regular rate and rhythm without murmur gallop or rub normal S1 and S2 Abdomen: negative abdominal pain, nondistended, positive soft, bowel sounds, no rebound, no ascites, no appreciable mass Extremities: No significant cyanosis, clubbing, or edema bilateral lower extremities Skin: Negative rashes, lesions, ulcers Psychiatric:  Negative depression, negative anxiety, negative fatigue, negative mania  Central nervous system:  Cranial nerves II through XII intact, tongue/uvula midline, all extremities muscle strength 5/5, sensation intact throughout, negative dysarthria, negative expressive aphasia, negative receptive aphasia.  .     Data Reviewed: Care during the described time interval was provided by me .  I have reviewed this patient's  available data, including medical history, events of note, physical examination, and all test results as part of my evaluation.  CBC: Recent Labs  Lab 12/28/19 0635 12/29/19 0618 12/30/19 0659 12/31/19 0500 01/01/20 0810  WBC 14.5* 12.2* 12.3* 12.9* 11.9*  NEUTROABS 12.7* 10.5* 9.3* 10.4* 8.8*  HGB 14.2 14.6 15.1 14.6 14.1  HCT 41.5 43.3 43.9 42.1 40.5  MCV 79.7* 82.0 81.1 78.8* 79.9*  PLT 334 298 310 311 364   Basic Metabolic Panel: Recent Labs  Lab 12/28/19 0635 12/29/19 0618 12/30/19 0659 12/31/19 0500 01/01/20 0810  NA 130* 130* 131* 131* 135  K 4.2 4.1 3.6 3.6 3.9  CL 94* 95* 93* 91* 98  CO2 26 24 26 28 28   GLUCOSE 301* 186* 112* 200* 96  BUN 27* 23* 20 29* 25*  CREATININE 1.12 0.98 1.02 1.08 1.02  CALCIUM 8.6* 8.9 8.6* 8.8* 8.7*  MG 2.3 2.1 2.0 2.2 2.5*  PHOS 3.4 4.5 4.0 4.1 4.1   GFR:  Estimated Creatinine Clearance: 125.9 mL/min (by C-G formula based on SCr of 1.02 mg/dL). Liver Function Tests: Recent Labs  Lab 12/28/19 0635 12/29/19 0618 12/30/19 0659 12/31/19 0500 01/01/20 0810  AST 30 26 28 22 19   ALT 80* 70* 56* 51* 45*  ALKPHOS 67 57 54 53 48  BILITOT 0.9 0.9 0.9 0.7 0.7  PROT 6.9 7.3 7.4 7.1 6.9  ALBUMIN 3.0* 3.3* 3.1* 2.9* 3.1*   No results for input(s): LIPASE, AMYLASE in the last 168 hours. No results for input(s): AMMONIA in the last 168 hours. Coagulation Profile: No results for input(s): INR, PROTIME in the last 168 hours. Cardiac Enzymes: No results for input(s): CKTOTAL, CKMB, CKMBINDEX, TROPONINI in the last 168 hours. BNP (last 3 results) No results for input(s): PROBNP in the last 8760 hours. HbA1C: No results for input(s): HGBA1C in the last 72 hours. CBG: Recent Labs  Lab 12/31/19 1213 12/31/19 1659 12/31/19 2056 01/01/20 0735 01/01/20 1140  GLUCAP 270* 176* 169* 100* 227*   Lipid Profile: No results for input(s): CHOL, HDL, LDLCALC, TRIG, CHOLHDL, LDLDIRECT in the last 72 hours. Thyroid Function Tests: No results  for input(s): TSH, T4TOTAL, FREET4, T3FREE, THYROIDAB in the last 72 hours. Anemia Panel: Recent Labs    12/31/19 0500 01/01/20 0810  FERRITIN 1,386* 1,119*   Sepsis Labs: No results for input(s): PROCALCITON, LATICACIDVEN in the last 168 hours.  No results found for this or any previous visit (from the past 240 hour(s)).       Radiology Studies: CT ANGIO CHEST PE W OR WO CONTRAST  Addendum Date: 12/31/2019   ADDENDUM REPORT: 12/31/2019 13:57 ADDENDUM: Critical Value/emergent results were called by telephone at the time of interpretation on 12/31/2019 at 1:55 pm to provider Chandler Stofer , who verbally acknowledged these results. Electronically Signed   By: 03/01/2020 M.D.   On: 12/31/2019 13:57   Result Date: 12/31/2019 CLINICAL DATA:  COVID-19 infection with progressive shortness of breath. Evaluate for pulmonary embolism. EXAM: CT ANGIOGRAPHY CHEST WITH CONTRAST TECHNIQUE: Multidetector CT imaging of the chest was performed using the standard protocol during bolus administration of intravenous contrast. Multiplanar CT image reconstructions and MIP's were obtained to evaluate the vascular anatomy. CONTRAST:  03/01/2020 OMNIPAQUE IOHEXOL 350 MG/ML SOLN COMPARISON:  Radiographs today and 12/19/2019. Chest CTA 12/25/2019. FINDINGS: Cardiovascular: The pulmonary arteries are well opacified with contrast to the level of the subsegmental branches. There is small volume acute thromboembolic disease involving segmental branches in the left lower lobe, best seen on coronal images 63 through 66 of series 7. No central or right-sided pulmonary emboli are seen. No significant systemic arterial abnormalities. The heart size is normal. There is no pericardial effusion. Mediastinum/Nodes: There are no enlarged mediastinal, hilar or axillary lymph nodes.Previously demonstrated pneumomediastinum has resolved. The thyroid gland, trachea and esophagus demonstrate no significant findings. Lungs/Pleura: There is no  pleural effusion or pneumothorax. Extensive ground-glass opacities throughout both lungs are again noted, similar to the previous study and consistent with viral pneumonia. There is slightly increased consolidation within the lingula. Upper abdomen: Decreased hepatic density suspicious for steatosis. There is wedge-shaped low-density within the spleen which may reflect a small splenic infarct. The visualized adrenal glands are unremarkable. Musculoskeletal/Chest wall: There is no chest wall mass or suspicious osseous finding. Review of the MIP images confirms the above findings. IMPRESSION: 1. Small volume acute thromboembolic disease involving segmental branches in the left lower lobe. No central or right-sided pulmonary emboli. 2. Extensive ground-glass opacities throughout both lungs, similar  to the previous study and consistent with known COVID-19 pneumonia. There is slightly increased consolidation within the lingula. 3. Resolution of previously demonstrated pneumomediastinum. No pneumothorax. 4. Possible small splenic infarct. Electronically Signed: By: Carey BullocksWilliam  Veazey M.D. On: 12/31/2019 13:46   DG Chest Port 1 View  Result Date: 12/31/2019 CLINICAL DATA:  Fever, cough, shortness of breath, COVID positive EXAM: PORTABLE CHEST 1 VIEW COMPARISON:  CT chest dated 12/25/2019 FINDINGS: Multifocal patchy opacities in the lungs bilaterally. No pleural effusion or pneumothorax. Heart is normal in size. IMPRESSION: Multifocal pneumonia in this patient with known COVID. Electronically Signed   By: Charline BillsSriyesh  Krishnan M.D.   On: 12/31/2019 07:39        Scheduled Meds: . albuterol  2 puff Inhalation Q6H  . amLODipine  10 mg Oral Daily  . apixaban  10 mg Oral BID   Followed by  . [START ON 01/08/2020] apixaban  5 mg Oral BID  . vitamin C  500 mg Oral Daily  . baricitinib  4 mg Oral Daily  . chlorhexidine  15 mL Mouth/Throat BID  . cholecalciferol  1,000 Units Oral Daily  . docusate sodium  100 mg Oral BID   . furosemide  40 mg Intravenous Daily  . HYDROcodone-homatropine  5 mL Oral Q4H  . insulin aspart  0-20 Units Subcutaneous TID WC  . insulin aspart  0-5 Units Subcutaneous QHS  . insulin aspart  10 Units Subcutaneous TID WC  . insulin detemir  30 Units Subcutaneous BID  . linagliptin  5 mg Oral Daily  . melatonin  5 mg Oral QHS  . metoprolol tartrate  12.5 mg Oral BID  . multivitamin with minerals  1 tablet Oral Daily  . nystatin  5 mL Oral QID  . oxymetazoline  1 spray Each Nare BID  . predniSONE  50 mg Oral Q breakfast  . Ensure Max Protein  11 oz Oral TID  . sodium chloride flush  10 mL Intravenous Q12H  . spironolactone  25 mg Oral Daily  . zinc sulfate  220 mg Oral Daily   Continuous Infusions: . fluconazole (DIFLUCAN) IV 50 mL/hr at 12/31/19 2121     LOS: 12 days    Time spent:40 min    Lolah Coghlan, Roselind MessierURTIS J, MD [facility] Pager [phone]  If 7PM-7AM, please contact night-coverage [email or web address] Password [email or web address] 01/01/2020, 3:14 PM

## 2020-01-01 NOTE — Progress Notes (Signed)
Patient asked me to update Dr. Joseph Art that he is still seeing spots/sparkles occasionally; no other symptoms, VSS. Patient states he told someone this earlier in his admission, but reports it is still happening occasionally. Notified Dr. Joseph Art who stated patient is a "long-hauler" and will likely have many long-lasting effects from his COVID infection. No new orders. Will educate patient and continue to monitor.

## 2020-01-01 NOTE — Progress Notes (Signed)
ANTICOAGULATION CONSULT NOTE  Pharmacy Consult for apixaban Indication: pulmonary embolus  No Known Allergies  Patient Measurements: Height: 6' (182.9 cm) Weight: 132.2 kg (291 lb 6.4 oz) IBW/kg (Calculated) : 77.6  Vital Signs: Temp: 97.8 F (36.6 C) (09/06 1137) Temp Source: Oral (09/06 1137) BP: 158/85 (09/06 1137) Pulse Rate: 93 (09/06 1137)  Labs: Recent Labs    12/30/19 0659 12/30/19 0659 12/31/19 0500 01/01/20 0810  HGB 15.1   < > 14.6 14.1  HCT 43.9  --  42.1 40.5  PLT 310  --  311 364  CREATININE 1.02  --  1.08 1.02   < > = values in this interval not displayed.    Estimated Creatinine Clearance: 125.9 mL/min (by C-G formula based on SCr of 1.02 mg/dL).   Medical History: Past Medical History:  Diagnosis Date  . COVID-19 12/19/2019   Diagnosed at [facility] on 12/19/2019  . Diabetes mellitus without complication (HCC)   . History of tobacco use   . Hyperlipidemia   . Hypertension   . Obesity     Medications:  Patient was not on anticoagulation prior to admission Previously had prophylactic lovenox 8/25>>9/1   Assessment: Pharmacy has been consulted for therapeutic lovenox dosing in a 49yo male with PE. Patient is positive for COVID-19 and chest CT consistent with COVID-19 PNA and showed small volume acute thromboembolic disease involving segmental branches in the left lower lobe. No central or right-sided pulmonary emboli. Hgb and Plt wnl.   Pt transitioned to treatment dose enoxaparin on 9/5 --> apixaban on 9/6   Goal of Therapy:  Monitor platelets by anticoagulation protocol: Yes   Plan:  D/c enoxaparin - last dose was at 0842 today Start apixaban 10 mg PO BID x7 days then 5 mg PO BID - to start at 2000 tonight SCr and CBC at least every three days per policy  Crist Fat, PharmD, BCPS Clinical Pharmacist 01/01/2020 1:46 PM

## 2020-01-01 NOTE — Progress Notes (Signed)
Pulmonary Medicine          Date: 01/01/2020,   MRN# [id number] [name] [birth year]     AdmissionWeight: (!) 142.8 kg                 CurrentWeight: 132.2 kg  Referring physician: Dr Allena Katz    CHIEF COMPLAINT:   Acute hypoxemic respiratory failure due to COVID19   SUBJECTIVE     [name] is a 49 y.o. male with medical history significant for diabetes, hypertension and obesity, unvaccinated against Covid who presents to the emergency room with a several day history of fever cough and shortness of breath.  His wife has similar but milder symptoms.  Due to progressing symptoms came into the emergency room for evaluation.   On arrival he was febrile at 102.2 tachypneic at 22 with O2 sat 97% on room air but desaturating to 86 with ambulation.  BP 183/77.  Covid test positive, chest x-ray showed patchy bilateral airspace disease compatible with Covid pneumonia.  Patient started on usual treatment, oxygen.  Hospitalist consulted for admission.  Patient was then treated for hypertensive emergency. Patient had additional chest imaging with pneumomediastinum with pulmonary consultation placed for further evaluation and management.     12/26/19- patient is in no distress, we discussed pneumomediastinum. He is feels cough is improved with hycodan. Continue current care plan.   12/27/19- Patient is weaned to 6L/min, he enjoys higher setting and wants 13L/min. 12/28/19- patient continues to use unnecessarily high oxygen therapy. He is 100% saturation. Please wean with goal sPO2 88-92.  12/29/19- ordered POC evaluation with [employer] [facility]. Patient is down to 5L/min. 01/01/20- reviewed CT images with patient at bedside. Patient is happy with overall progress and appreciative of care.  He was off supplemental O2 during interview today and was normoxic at rest. He will need O2 with exertion temporarily during recovery over next 3 months. Recommend evaluation for possible d/c home on  supplemental O2.  I have placed POC evaluation order for supplemental oxygen at home.   9/6/219/12/29/19 12/27/19  PAST MEDICAL HISTORY   Past Medical History:  Diagnosis Date  . COVID-19 12/19/2019   Diagnosed at [facility] on 12/19/2019  . Diabetes mellitus without complication (HCC)   . History of tobacco use   . Hyperlipidemia   . Hypertension   . Obesity      SURGICAL HISTORY   History reviewed. No pertinent surgical history.   FAMILY HISTORY   Family History  Problem Relation Age of Onset  . Hypertension Mother   . Diabetes Mother   . Diabetes Maternal Grandmother      SOCIAL HISTORY   Social History   Tobacco Use  . Smoking status: Former Smoker    Types: Cigarettes  . Smokeless tobacco: Never Used  . Tobacco comment: occasional  Vaping Use  . Vaping Use: Never used  Substance Use Topics  . Alcohol use: No    Alcohol/week: 0.0 standard drinks  . Drug use: No     MEDICATIONS    Home Medication:    Current Medication:  Current Facility-Administered Medications:  .  acetaminophen (TYLENOL) tablet 650 mg, 650 mg, Oral, Q6H PRN, Andris Baumann, MD, 650 mg at 12/20/19 2348 .  albuterol (VENTOLIN HFA) 108 (90 Base) MCG/ACT inhaler 2 puff, 2 puff, Inhalation, Q6H, Andris Baumann, MD, 2 puff at 01/01/20 0844 .  amLODipine (NORVASC) tablet 10 mg, 10 mg, Oral, Daily, Drema Dallas, MD, 10 mg  at 01/01/20 2563 .  ascorbic acid (VITAMIN C) tablet 500 mg, 500 mg, Oral, Daily, Lindajo Royal V, MD, 500 mg at 01/01/20 0841 .  baricitinib (OLUMIANT) tablet 4 mg, 4 mg, Oral, Daily, Lindajo Royal V, MD, 4 mg at 01/01/20 0844 .  chlorhexidine (PERIDEX) 0.12 % solution 15 mL, 15 mL, Mouth/Throat, BID, Rolly Salter, MD, 15 mL at 01/01/20 0843 .  chlorpheniramine-HYDROcodone (TUSSIONEX) 10-8 MG/5ML suspension 5 mL, 5 mL, Oral, Q12H PRN, Andris Baumann, MD, 5 mL at 12/31/19 1054 .  cholecalciferol (VITAMIN D3) tablet 1,000 Units, 1,000 Units, Oral, Daily, Rolly Salter,  MD, 1,000 Units at 01/01/20 [phone]  docusate sodium (COLACE) capsule 100 mg, 100 mg, Oral, BID, [place], Hubbard Hartshorn, NP, 100 mg at 01/01/20 0839 .  enoxaparin (LOVENOX) injection 130 mg, 130 mg, Subcutaneous, Q12H, Rauer, Robyne Peers, RPH, 130 mg at 01/01/20 0842 .  fluconazole (DIFLUCAN) IVPB 100 mg, 100 mg, Intravenous, Q24H, Drema Dallas, MD, Last Rate: 50 mL/hr at 12/31/19 2121, IV Pump Association at 12/31/19 2121 .  furosemide (LASIX) injection 40 mg, 40 mg, Intravenous, Daily, Vida Rigger, MD, 40 mg at 01/01/20 3428 .  hydrALAZINE (APRESOLINE) tablet 25 mg, 25 mg, Oral, Q6H PRN, Rolly Salter, MD, 25 mg at 12/28/19 1218 .  HYDROcodone-homatropine (HYCODAN) 5-1.5 MG/5ML syrup 5 mL, 5 mL, Oral, Q4H, Raizel Wesolowski, MD, 5 mL at 01/01/20 0844 .  insulin aspart (novoLOG) injection 0-20 Units, 0-20 Units, Subcutaneous, TID WC, Andris Baumann, MD, 7 Units at 01/01/20 1156 .  insulin aspart (novoLOG) injection 0-5 Units, 0-5 Units, Subcutaneous, QHS, Andris Baumann, MD, 4 Units at 12/30/19 2155 .  insulin aspart (novoLOG) injection 10 Units, 10 Units, Subcutaneous, TID WC, Rolly Salter, MD, 10 Units at 01/01/20 1157 .  insulin detemir (LEVEMIR) injection 30 Units, 30 Units, Subcutaneous, BID, Drema Dallas, MD, 30 Units at 01/01/20 [phone] .  labetalol (NORMODYNE) injection 5 mg, 5 mg, Intravenous, Q2H PRN, Rolly Salter, MD, 5 mg at 12/21/19 1547 .  linagliptin (TRADJENTA) tablet 5 mg, 5 mg, Oral, Daily, Lindajo Royal V, MD, 5 mg at 01/01/20 1572 .  melatonin tablet 5 mg, 5 mg, Oral, QHS, Manuela Schwartz, NP, 5 mg at 12/31/19 2114 .  menthol-cetylpyridinium (CEPACOL) lozenge 3 mg, 1 lozenge, Oral, PRN, Rolly Salter, MD .  multivitamin with minerals tablet 1 tablet, 1 tablet, Oral, Daily, Andris Baumann, MD, 1 tablet at 01/01/20 [phone] .  nystatin (MYCOSTATIN) 100000 UNIT/ML suspension 500,000 Units, 5 mL, Oral, QID, Rolly Salter, MD, 500,000 Units at 01/01/20 [phone] .   ondansetron (ZOFRAN) tablet 4 mg, 4 mg, Oral, Q6H PRN **OR** ondansetron (ZOFRAN) injection 4 mg, 4 mg, Intravenous, Q6H PRN, Andris Baumann, MD, 4 mg at 12/26/19 1020 .  oxymetazoline (AFRIN) 0.05 % nasal spray 1 spray, 1 spray, Each Nare, BID, Rolly Salter, MD, 1 spray at 12/31/19 2116 .  phenol (CHLORASEPTIC) mouth spray 1 spray, 1 spray, Mouth/Throat, PRN, Rolly Salter, MD, 1 spray at 12/23/19 1104 .  predniSONE (DELTASONE) tablet 50 mg, 50 mg, Oral, Q breakfast, Elonzo Sopp, MD, 50 mg at 01/01/20 0841 .  protein supplement (ENSURE MAX) liquid, 11 oz, Oral, TID, Rolly Salter, MD, 11 oz at 12/31/19 1721 .  sodium chloride (OCEAN) 0.65 % nasal spray 1 spray, 1 spray, Each Nare, PRN, Rolly Salter, MD .  sodium chloride flush (NS) 0.9 % injection 10 mL, 10 mL, Intravenous, Q12H, Lynden Oxford  M, MD, 10 mL at 01/01/20 0844 .  spironolactone (ALDACTONE) tablet 25 mg, 25 mg, Oral, Daily, 25 mg at 01/01/20 0841 **AND** [DISCONTINUED] hydrochlorothiazide (HYDRODIURIL) tablet 25 mg, 25 mg, Oral, Daily, Rolly SalterPatel, Pranav M, MD, 25 mg at 12/29/19 1013 .  zinc sulfate capsule 220 mg, 220 mg, Oral, Daily, Lindajo Royaluncan, Hazel V, MD, 220 mg at 01/01/20 0840    ALLERGIES   Patient has no known allergies.     REVIEW OF SYSTEMS    Review of Systems:  Gen:  Denies  fever, sweats, chills weigh loss  HEENT: Denies blurred vision, double vision, ear pain, eye pain, hearing loss, nose bleeds, sore throat Cardiac:  No dizziness, chest pain or heaviness, chest tightness,edema Resp:   Cough, dyspnea+ Gi: Denies swallowing difficulty, stomach pain, nausea or vomiting, diarrhea, constipation, bowel incontinence Gu:  Denies bladder incontinence, burning urine Ext:   Denies Joint pain, stiffness or swelling Skin: Denies  skin rash, easy bruising or bleeding or hives Endoc:  Denies polyuria, polydipsia , polyphagia or weight change Psych:   Denies depression, insomnia or hallucinations   Other:  All  other systems negative   VS: BP (!) 158/85 (BP Location: Right Arm)   Pulse 93   Temp 97.8 F (36.6 C) (Oral)   Resp 20   Ht 6' (1.829 m)   Wt 132.2 kg   SpO2 97%   BMI 39.52 kg/m      PHYSICAL EXAM    GENERAL:NAD, no fevers, chills, no weakness no fatigue HEAD: Normocephalic, atraumatic.  EYES: Pupils equal, round, reactive to light. Extraocular muscles intact. No scleral icterus.  MOUTH: Moist mucosal membrane. Dentition intact. No abscess noted.  EAR, NOSE, THROAT: Clear without exudates. No external lesions.  NECK: Supple. No thyromegaly. No nodules. No JVD.  PULMONARY:clear to auscultation bilaterally  CARDIOVASCULAR: S1 and S2. Regular rate and rhythm. No murmurs, rubs, or gallops. No edema. Pedal pulses 2+ bilaterally.  GASTROINTESTINAL: Soft, nontender, nondistended. No masses. Positive bowel sounds. No hepatosplenomegaly.  MUSCULOSKELETAL: No swelling, clubbing, or edema. Range of motion full in all extremities.  NEUROLOGIC: Cranial nerves II through XII are intact. No gross focal neurological deficits. Sensation intact. Reflexes intact.  SKIN: No ulceration, lesions, rashes, or cyanosis. Skin warm and dry. Turgor intact.  PSYCHIATRIC: Mood, affect within normal limits. The patient is awake, alert and oriented x 3. Insight, judgment intact.       IMAGING    DG Chest 1 View  Result Date: 12/19/2019 CLINICAL DATA:  Fever, cough, COVID EXAM: CHEST  1 VIEW COMPARISON:  None. FINDINGS: Heart is normal size. Patchy bilateral airspace opacities. No effusions or pneumothorax. No acute bony abnormality. IMPRESSION: Patchy bilateral airspace disease compatible with COVID pneumonia. Electronically Signed   By: Charlett NoseKevin  Dover M.D.   On: 12/19/2019 23:20   CT ANGIO CHEST PE W OR WO CONTRAST  Addendum Date: 12/31/2019   ADDENDUM REPORT: 12/31/2019 13:57 ADDENDUM: Critical Value/emergent results were called by telephone at the time of interpretation on 12/31/2019 at 1:55 pm to  provider CURTIS WOODS , who verbally acknowledged these results. Electronically Signed   By: Carey BullocksWilliam  Veazey M.D.   On: 12/31/2019 13:57   Result Date: 12/31/2019 CLINICAL DATA:  COVID-19 infection with progressive shortness of breath. Evaluate for pulmonary embolism. EXAM: CT ANGIOGRAPHY CHEST WITH CONTRAST TECHNIQUE: Multidetector CT imaging of the chest was performed using the standard protocol during bolus administration of intravenous contrast. Multiplanar CT image reconstructions and MIP's were obtained to evaluate the vascular anatomy.  CONTRAST:  OMNIPAQUE IOHEXOL 350 MG/ML SOLN COMPARISON:  Radiographs today and 12/19/2019. Chest CTA 12/25/2019. FINDINGS: Cardiovascular: The pulmonary arteries are well opacified with contrast to the level of the subsegmental branches. There is small volume acute thromboembolic disease involving segmental branches in the left lower lobe, best seen on coronal images 63 through 66 of series 7. No central or right-sided pulmonary emboli are seen. No significant systemic arterial abnormalities. The heart size is normal. There is no pericardial effusion. Mediastinum/Nodes: There are no enlarged mediastinal, hilar or axillary lymph nodes.Previously demonstrated pneumomediastinum has resolved. The thyroid gland, trachea and esophagus demonstrate no significant findings. Lungs/Pleura: There is no pleural effusion or pneumothorax. Extensive ground-glass opacities throughout both lungs are again noted, similar to the previous study and consistent with viral pneumonia. There is slightly increased consolidation within the lingula. Upper abdomen: Decreased hepatic density suspicious for steatosis. There is wedge-shaped low-density within the spleen which may reflect a small splenic infarct. The visualized adrenal glands are unremarkable. Musculoskeletal/Chest wall: There is no chest wall mass or suspicious osseous finding. Review of the MIP images confirms the above findings.  IMPRESSION: 1. Small volume acute thromboembolic disease involving segmental branches in the left lower lobe. No central or right-sided pulmonary emboli. 2. Extensive ground-glass opacities throughout both lungs, similar to the previous study and consistent with known COVID-19 pneumonia. There is slightly increased consolidation within the lingula. 3. Resolution of previously demonstrated pneumomediastinum. No pneumothorax. 4. Possible small splenic infarct. Electronically Signed: By: Carey Bullocks M.D. On: 12/31/2019 13:46   CT ANGIO CHEST PE W OR WO CONTRAST  Result Date: 12/25/2019 CLINICAL DATA:  Worsening hypoxia, COVID-19 positive EXAM: CT ANGIOGRAPHY CHEST WITH CONTRAST TECHNIQUE: Multidetector CT imaging of the chest was performed using the standard protocol during bolus administration of intravenous contrast. Multiplanar CT image reconstructions and MIPs were obtained to evaluate the vascular anatomy. CONTRAST:  66mL OMNIPAQUE IOHEXOL 350 MG/ML SOLN COMPARISON:  None. FINDINGS: Cardiovascular: Satisfactory opacification of the pulmonary arteries to the segmental level. No evidence of pulmonary embolism. Normal heart size. No pericardial effusion. Mediastinum/Nodes: No enlarged mediastinal, hilar, or axillary lymph nodes. Thyroid gland, trachea, and esophagus demonstrate no significant findings. Extensive pneumomediastinum dissecting into the neck, right worse than left. Lungs/Pleura: No pleural effusion or pneumothorax. Bilateral patchy ground-glass opacities and airspace disease throughout the lungs consistent with patient's known history of COVID-19 pneumonia. Upper Abdomen: No acute abnormality. Musculoskeletal: No chest wall abnormality. No acute or significant osseous findings. Review of the MIP images confirms the above findings. IMPRESSION: 1. No evidence of pulmonary embolus. 2. Extensive pneumomediastinum dissecting into the neck, right worse than left. 3. Bilateral patchy ground-glass  opacities and airspace disease throughout the lungs consistent with patient's known history of COVID-19 pneumonia. Electronically Signed   By: Elige Ko   On: 12/25/2019 15:04   DG Chest Port 1 View  Result Date: 12/31/2019 CLINICAL DATA:  Fever, cough, shortness of breath, COVID positive EXAM: PORTABLE CHEST 1 VIEW COMPARISON:  CT chest dated 12/25/2019 FINDINGS: Multifocal patchy opacities in the lungs bilaterally. No pleural effusion or pneumothorax. Heart is normal in size. IMPRESSION: Multifocal pneumonia in this patient with known COVID. Electronically Signed   By: Charline Bills M.D.   On: 12/31/2019 07:39        ASSESSMENT/PLAN   Spontaneous pneumomediastinum-Resolved - decrease shearing forces to minimize progression - will add scheduled Hycodan for now, will d/c vicodin to prevent overtreatment adverse effect such as respiratory depression   - supportive care  only   - may pause bronchopulmonary hygiene such as vest therapy/incentive spirometery/ etc.   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   Patient/Family are satisfied with care plan and all questions have been answered.   This document was  prepared using Dragon voice recognition software and may include unintentional dictation errors.     , M.D.  Division of Pulmonary & Critical Care Medicine  [facility]

## 2020-01-01 NOTE — Progress Notes (Signed)
CBG 321 per Arlisha NT. Glucometer has not synced yet.

## 2020-01-02 DIAGNOSIS — K120 Recurrent oral aphthae: Secondary | ICD-10-CM

## 2020-01-02 DIAGNOSIS — R7989 Other specified abnormal findings of blood chemistry: Secondary | ICD-10-CM

## 2020-01-02 LAB — CBC WITH DIFFERENTIAL/PLATELET
Abs Immature Granulocytes: 0.14 10*3/uL — ABNORMAL HIGH (ref 0.00–0.07)
Basophils Absolute: 0.1 10*3/uL (ref 0.0–0.1)
Basophils Relative: 0 %
Eosinophils Absolute: 0 10*3/uL (ref 0.0–0.5)
Eosinophils Relative: 0 %
HCT: 41 % (ref 39.0–52.0)
Hemoglobin: 14 g/dL (ref 13.0–17.0)
Immature Granulocytes: 1 %
Lymphocytes Relative: 17 %
Lymphs Abs: 2.2 10*3/uL (ref 0.7–4.0)
MCH: 27.5 pg (ref 26.0–34.0)
MCHC: 34.1 g/dL (ref 30.0–36.0)
MCV: 80.4 fL (ref 80.0–100.0)
Monocytes Absolute: 0.9 10*3/uL (ref 0.1–1.0)
Monocytes Relative: 7 %
Neutro Abs: 9.7 10*3/uL — ABNORMAL HIGH (ref 1.7–7.7)
Neutrophils Relative %: 75 %
Platelets: 417 10*3/uL — ABNORMAL HIGH (ref 150–400)
RBC: 5.1 MIL/uL (ref 4.22–5.81)
RDW: 14.8 % (ref 11.5–15.5)
WBC: 13 10*3/uL — ABNORMAL HIGH (ref 4.0–10.5)
nRBC: 0 % (ref 0.0–0.2)

## 2020-01-02 LAB — C-REACTIVE PROTEIN: CRP: 1.5 mg/dL — ABNORMAL HIGH (ref <1.0)

## 2020-01-02 LAB — FIBRIN DERIVATIVES D-DIMER (ARMC ONLY): Fibrin derivatives D-dimer (ARMC): 4427.51 ng/mL (FEU) — ABNORMAL HIGH (ref 0.00–499.00)

## 2020-01-02 LAB — GLUCOSE, CAPILLARY
Glucose-Capillary: 321 mg/dL — ABNORMAL HIGH (ref 70–99)
Glucose-Capillary: 116 mg/dL — ABNORMAL HIGH (ref 70–99)
Glucose-Capillary: 286 mg/dL — ABNORMAL HIGH (ref 70–99)
Glucose-Capillary: 113 mg/dL — ABNORMAL HIGH (ref 70–99)

## 2020-01-02 LAB — PHOSPHORUS: Phosphorus: 4.4 mg/dL (ref 2.5–4.6)

## 2020-01-02 LAB — FERRITIN: Ferritin: 1227 ng/mL — ABNORMAL HIGH (ref 24–336)

## 2020-01-02 LAB — MAGNESIUM: Magnesium: 2.5 mg/dL — ABNORMAL HIGH (ref 1.7–2.4)

## 2020-01-02 LAB — LACTATE DEHYDROGENASE: LDH: 375 U/L — ABNORMAL HIGH (ref 98–192)

## 2020-01-02 MED ORDER — ALBUTEROL SULFATE HFA 108 (90 BASE) MCG/ACT IN AERS: 2.0000 | INHALATION_SPRAY | Freq: Four times a day (QID) | RESPIRATORY_TRACT | 0 refills | Status: DC

## 2020-01-02 MED ORDER — VITAMIN D3 25 MCG PO TABS: 1000.0000 [IU] | ORAL_TABLET | Freq: Every day | ORAL | 0 refills | Status: DC

## 2020-01-02 MED ORDER — SPIRONOLACTONE 25 MG PO TABS
25.0000 mg | ORAL_TABLET | Freq: Every day | ORAL | 0 refills | Status: DC
Start: 2020-01-02 — End: 2020-01-03

## 2020-01-02 MED ORDER — PHENOL 1.4 % MT LIQD: 1.0000 | OROMUCOSAL | 0 refills | Status: DC | PRN

## 2020-01-02 MED ORDER — MENTHOL 3 MG MT LOZG: 1.0000 | LOZENGE | OROMUCOSAL | 0 refills | Status: DC | PRN

## 2020-01-02 MED ORDER — ASCORBIC ACID 500 MG PO TABS
500.0000 mg | ORAL_TABLET | Freq: Every day | ORAL | 0 refills | Status: DC
Start: 2020-01-02 — End: 2020-01-03

## 2020-01-02 MED ORDER — ONDANSETRON HCL 4 MG PO TABS: 4.0000 mg | ORAL_TABLET | Freq: Four times a day (QID) | ORAL | 0 refills | Status: DC | PRN

## 2020-01-02 MED ORDER — OXYMETAZOLINE HCL 0.05 % NA SOLN: 1.0000 | Freq: Two times a day (BID) | NASAL | 0 refills | Status: DC

## 2020-01-02 MED ORDER — NOVOLOG FLEXPEN 100 UNIT/ML ~~LOC~~ SOPN: 10.0000 [IU] | PEN_INJECTOR | Freq: Three times a day (TID) | SUBCUTANEOUS | 0 refills | Status: DC

## 2020-01-02 MED ORDER — APIXABAN 5 MG PO TABS
10.0000 mg | ORAL_TABLET | Freq: Two times a day (BID) | ORAL | 0 refills | Status: DC
Start: 2020-01-02 — End: 2020-01-03

## 2020-01-02 MED ORDER — LINAGLIPTIN 5 MG PO TABS: 5.0000 mg | ORAL_TABLET | Freq: Every day | ORAL | 0 refills | Status: DC

## 2020-01-02 MED ORDER — INSULIN STARTER KIT- PEN NEEDLES (ENGLISH)
1.0000 | Freq: Once | Status: DC
  Filled 2020-01-02: qty 1

## 2020-01-02 MED ORDER — ZINC SULFATE 220 (50 ZN) MG PO CAPS: 220.0000 mg | ORAL_CAPSULE | Freq: Every day | ORAL | 0 refills | Status: DC

## 2020-01-02 MED ORDER — AMLODIPINE BESYLATE 10 MG PO TABS
10.0000 mg | ORAL_TABLET | Freq: Every day | ORAL | 0 refills | Status: DC
Start: 2020-01-02 — End: 2020-01-03

## 2020-01-02 MED ORDER — APIXABAN 5 MG PO TABS
5.0000 mg | ORAL_TABLET | Freq: Two times a day (BID) | ORAL | 0 refills | Status: DC
Start: 2020-01-08 — End: 2020-01-03

## 2020-01-02 MED ORDER — INSULIN DETEMIR 100 UNIT/ML FLEXPEN: 30.0000 [IU] | Freq: Every day | SUBCUTANEOUS | 0 refills | Status: DC

## 2020-01-02 MED ORDER — FLUCONAZOLE 100 MG PO TABS
100.0000 mg | ORAL_TABLET | Freq: Every day | ORAL | 0 refills | Status: DC
Start: 2020-01-02 — End: 2020-01-03

## 2020-01-02 MED ORDER — ADULT MULTIVITAMIN W/MINERALS CH: 1.0000 | ORAL_TABLET | Freq: Every day | ORAL | 0 refills | Status: DC

## 2020-01-02 MED ORDER — METOPROLOL TARTRATE 25 MG PO TABS
12.5000 mg | ORAL_TABLET | Freq: Two times a day (BID) | ORAL | 0 refills | Status: DC
Start: 2020-01-02 — End: 2020-01-03

## 2020-01-02 MED ORDER — ATORVASTATIN CALCIUM 20 MG PO TABS
20.0000 mg | ORAL_TABLET | Freq: Every day | ORAL | 0 refills | Status: DC
Start: 2020-01-02 — End: 2020-01-03

## 2020-01-02 MED ORDER — MELATONIN 5 MG PO TABS
5.0000 mg | ORAL_TABLET | Freq: Every day | ORAL | 0 refills | Status: DC
Start: 2020-01-02 — End: 2020-01-03

## 2020-01-02 MED ORDER — INSULIN PEN NEEDLE 31G X 8 MM MISC: 0 refills | Status: DC

## 2020-01-02 MED ORDER — FLUCONAZOLE 100 MG PO TABS
100.0000 mg | ORAL_TABLET | Freq: Every day | ORAL | Status: DC
  Administered 2020-01-02: 100 mg via ORAL
  Filled 2020-01-02 (×2): qty 1

## 2020-01-02 MED ORDER — SORBITOL 70 % SOLN
960.0000 mL | TOPICAL_OIL | Freq: Once | ORAL | Status: DC
  Filled 2020-01-02 (×3): qty 473

## 2020-01-02 MED ORDER — ATORVASTATIN CALCIUM 20 MG PO TABS
20.0000 mg | ORAL_TABLET | Freq: Every day | ORAL | Status: DC
  Administered 2020-01-02 – 2020-01-03 (×2): 20 mg via ORAL
  Filled 2020-01-02 (×2): qty 1

## 2020-01-02 NOTE — Progress Notes (Signed)
I spoke with Georgina Peer RN who cared for patient last week. She received the diabetic teaching kit and worked with patient at length. He stated he understood how to use and his girlfriend/wife is also diabetic and he stated she could help him at home.

## 2020-01-02 NOTE — Progress Notes (Signed)
[name] spoke with patient at length at bedside. Patient agreed per [name] he will provide a CC# tomorrow for the [employer] oxygen requirements. He understands he has to go tomorrow. Told Scott he was fearful because no one will help him at home. She informed him he will need to call 911 at home if he has an emergency. He is in agreement,

## 2020-01-02 NOTE — Progress Notes (Signed)
Patient now states no one has called him reference his oxygen. He reported this phone number: [phone] he can be reached at.  He also states he has not received any diabetic education about using pen at home and needs education before he can go home.

## 2020-01-02 NOTE — Progress Notes (Signed)
Inpatient Diabetes Program Recommendations  AACE/ADA: New Consensus Statement on Inpatient Glycemic Control (2015)  Target Ranges:  Prepandial:   less than 140 mg/dL      Peak postprandial:   less than 180 mg/dL (1-2 hours)      Critically ill patients:  140 - 180 mg/dL   Lab Results  Component Value Date   GLUCAP 286 (H) 01/02/2020   HGBA1C 10.4 (H) 12/20/2019    Review of Glycemic Control  Inpatient Diabetes Program Recommendations:   Spoke with RN Jasmine Pang regarding diabetes patient education needed. Ordered another insulin pen teaching kit with pen needles and RN plans to review with patient on how to administer to assure patient feels comfortable with insulin injections prior to discharge. Diabetes coordinator spoke with patient on 12/29/19 via phone.  Thank you, [name]. Scott Beg, RN, MSN, CDE  Diabetes Coordinator Inpatient Glycemic Control Team Team Pager [phone] (8am-5pm) 01/02/2020 2:51 PM

## 2020-01-02 NOTE — Progress Notes (Signed)
Gave patient meds at this time. While in room discussed why patient does not want to go home. Says he can not take care of himself. Asked him what he can no do for himself. He can not give me an answer. NT Arlisha at bedside with me at time for verification of conversation. Patient VERY combative. I will not go back into room today without someone else for proof of events.

## 2020-01-02 NOTE — Progress Notes (Signed)
This RN took FULL oxygen into room with wheeled carrier. I personally connected patient to the tank. He said he need to go to the bathroom, felt he needed to urinate and have bowel movement. I also stood by and watched patient ambulate independently to bathroom without any shuffle nor delay. He wheeled oxygen right on into bathroom by himself, removed his clothes independently and sat down on toilet. Told me he would call if he needed help after he was done. I assured him I was available and willing to help him if he needs help. He has not called out at this time for help. He himself turned the 02 tank up to 10L prior to getting out of his chair to go to bathroom.

## 2020-01-02 NOTE — Progress Notes (Signed)
[name] talked to him on 12-29-19 about diabetic education. He refused education via over the phone with them at that time. He would not give an email to them at that time. He should have teaching kit in room. [name] is ordering patient another teaching kit. I will attempt to work with patient once it arrives.  This patient requires MASSIVE amount of time to spend at bedside and is negatively affecting patient care of my other patients.

## 2020-01-02 NOTE — Discharge Summary (Addendum)
Physician Discharge Summary  [name] TML:[id number] DOB: Feb 16, 1971 DOA: 12/19/2019  PCP: Patient, No Pcp Per  Admit date: 12/19/2019 Discharge date: 01/02/2020  Time spent: 35 minutes  Recommendations for Outpatient Follow-up:   Covid vaccination; not vaccinated  Acute respiratory failure with hypoxia/Covid pneumonia  COVID-19 Labs  Recent Labs    12/31/19 0500 01/01/20 0810 01/02/20 0710  FERRITIN 1,386* 1,119* 1,227*  LDH 424* 388* 375*  CRP 11.8* 3.3*  --     Lab Results  Component Value Date   SARSCOV2NAA POSITIVE (A) 12/19/2019   -Remdesivir complete -8/24 Baricitinib   -9/2 DC Decadron; Solu-Medrol 60 mg BID.  Completed 10-day course of steroids -Vitamin C and zinc per Covid protocol -Albuterol QID SATURATION QUALIFICATIONS: (Thisnote is usedto comply with regulatory documentation for home oxygen) Patient Saturations on Room Air at Rest =88% Patient Saturations on [employer] while Ambulating =80% Patient Saturations on3Liters of oxygen while Ambulating = 93% Please briefly explain why patient needs home oxygen:Patient's oxygen saturation dropped below 88% with minimal exertion on room air. -Patient meets criteria for home O2 -3 L O2; titrate to maintain SPO2> 88% -Provide Inogen home portable O2 concentrator -Patient has been counseled that he will be contagious until 9/14, he will need to take the below precautions to protect himself and others  Pneumoperitoneum. -Pulmonary consulted for pneumoperitoneum.Currently conservative measures with close monitoring. No incentive spirometry. -The investigational nature of this medication was discussed with the patient/HCPOA and they choose to proceed as the potential benefits are felt to outweigh risks at this time.  -Next week will obtain repeat CT scan.  At that point will discuss treatment options with critical care/surgery.  If not reabsorbing may need to become more aggressive -9/7 repeat CTA PE  protocol pending, reevaluate progression of pneumoperitoneum. -9/5 CTA PE protocol pneumoperitoneum resolved see results below  ACUTE LEFT lower lobe PE  -9/5 increase Lovenox full dose per pharmacy -9/6 start Eliquis per pharmacy  -9/6 discussed case with Dr. Chilton Si Aleskerovpulmonologist who has arranged for home O2 for patient. -9/6 schedule follow-up appointment with Dr. Chilton Si Aleskerovpulmonologist in 2 weeks pneumoperitoneum, left lower lobe PE -Patient needs to follow-up with PCP that [name] arranges for patient, who will decide at 3 to 6 months whether to stop anticoagulation.  Splenic infarct  -Per CTA PE protocol 9/5 possible small splenic infarct see results below  HTN urgency -9/2 increase amlodipine 10 mg daily -9/6 metoprolol 12.5 mg BID -Labetalol PRN -Spironolactone 25 mg daily -HCTZ 25 mg daily (hold) will not restart at discharge secondary to hyponatremia -9/6 spoke with [name] she will arrange for patient to have follow-up with physician HTN urgency, uncontrolled diabetes, HLD.  Hyponatremia Lab Results  Component Value Date   NA 135 01/01/2020   NA 131 (L) 12/31/2019   NA 131 (L) 12/30/2019   NA 130 (L) 12/29/2019   NA 130 (L) 12/28/2019  -We will hold HCTZ -9/6 has resolved would not restart HCTZ  Hypokalemia -Potassium goal> 4  DM type II uncontrolled with hyperglycemia -8/25 hemoglobin A1c= 10.4 -9/5 increase Levemir 30 units BID -NovoLog 10 units `qac -Tradjenta 5 mg daily  HLD -9/3 l LDL= 109 -Now the patient completed Covid medication with start Lipitor 20 mg daily.  PCP to titrate for effect  Elevated LFT -Mildly elevated  Leukocytosis -Negative left shift negative bands most likely secondary to steroids  Obesity(40.23 kg/m.)  Oral ulcer -Patient has seen urgent care for the same. -Continuing chlorhexidine and Magic  mouthwash with lidocaine. -Anticipating poor wound healing in the setting of patient  being on steroid as well as baricitinib -Per EMR discussed with ENT on-call,Outpatient ENT follow-up recommended.  Thrush oral/esophageal -9/4 fluconazole IV 200 mg x 1 -9/7 fluconazole PO 100 mg daily x4 days  Oral thrush. -Nystatin Added.  Elevated D-dimer. -D-dimer is trending upwards despite improvement in other inflammatory markers. -At present patient does not have any new hypotension or worsening hypoxia or tachycardia. -Initial CT PE protocol is also negative for pulmonary bleeding. -9/5 repeat CTA PE protocol positive for acute PE see above.  Patient counseled he will need to remain on anticoagulation for 3 to 6 months.  Epistaxis. -Afrin spray scheduled. Also Ocean Spray. Currently resolved.  Obese (BMI 39.9 kg/m) -During acute illness no place for nutritional consultation  ADDENDUM; spoke with patient at length today explaining that yesterday myself and Dr. Chilton Si Aleskerovpulmonologist had explained to him at length that he was safe for discharge today.  I reminded him that yesterday the pulmonologist actually said he could be discharged, however I wanted to make sure that we ensure that he had his home O2 delivered.  At this point patient became belligerent, stated that I was a liar and had never told him that he was going to be discharged nor had the pulmonologist informed him that he was going to be discharged.  He then changed the subject to the fact that he was constipated.  I informed him that at approximately 0 730 this morning I had written for medication for his constipation, he stated he never received it the nurse was standing right next to me and informed him that she had tried to give it to him but he refused because he wanted to use prune juice at that point he called her a liar.  I ended the conversation and tried to inform him that he could appeal the discharge if he liked however he would no longer be seen by myself nor Dr. Loralyn Freshwater, as  both of [place] had deemed him ready for discharge.  Take all  Discharge Diagnoses:  Principal Problem:   Pneumonia due to COVID-19 virus Active Problems:   Hypertension   Diabetes mellitus without complication (HCC)   Acute respiratory failure due to COVID-19 (HCC)   Obesity, Class III, BMI 40-49.9 (morbid obesity) (HCC)   Pulmonary embolus, left (HCC)   Splenic infarct   Discharge Condition: Stable  Diet recommendation: Heart healthy/carb modified  Filed Weights   12/31/19 0357 01/01/20 0450 01/02/20 0516  Weight: 130.9 kg 132.2 kg 132.5 kg    History of present illness:  49 y.o.BM PMHx DMTYpe II, HTN, obesity, unvaccinated against Covid   Presents to the emergency room with a several day history of fever cough and shortness of breath. His wife has similar but milder symptoms. Due to progressing symptoms came into the emergency room for evaluation ED Course:On arrival he was febrile at 102.2 tachypneic at 22 with O2 sat 97% on room air but desaturating to 86 with ambulation. BP 183/77. Covid test positive, chest x-ray showed patchy bilateral airspace disease compatible with Covid pneumonia. Patient started on usual treatment, oxygen. Hospitalist consulted for admission.  Past medical history of morbid obesity. Patient does not take any medications for diabetes or hypertension. Presents with shortness of breath and found to have COVID-19 pneumonia as well as hypertensive urgency. Currently plan is continue current carefor hypoxia.  Hospital Course:  See above  Procedures: 8/30 CTA chest PE protocol;No evidence  of pulmonary embolus. -Extensive pneumomediastinum dissecting into the neck, RIGHT>> LEFT. -Bilateral patchy ground-glass opacities and airspace disease throughout the lungs consistent with patient's known history of COVID-19 pneumonia. 9/5 PCXR;Multifocal pneumonia in this patient with known COVID 9/5 CTA chest PE protocol;-small volume acute thromboembolic  disease involving segmental branches in the left lower lobe. -Extensive ground-glass opacities throughout both lungs, similar to the previous study and consistent with known COVID-19 pneumonia.There is slightly increased consolidation within the lingula. -Resolution of previously demonstrated pneumomediastinum. No pneumothorax. -Possible small splenic infarct.  Consultations: PCCM ENT  Cultures   8/24 SARS coronavirus positive   Antibiotics Anti-infectives (From admission, onward)   Start     Ordered Stop   01/02/20 1800  fluconazole (DIFLUCAN) tablet 100 mg        01/02/20 0817 01/06/20 1759   12/31/19 2100  fluconazole (DIFLUCAN) IVPB 100 mg  Status:  Discontinued        12/30/19 2136 01/02/20 0817   12/30/19 2230  fluconazole (DIFLUCAN) IVPB 200 mg        12/30/19 2135 12/30/19 2359   12/21/19 1000  remdesivir 100 mg in sodium chloride 0.9 % 100 mL IVPB       "Followed by" Linked Group Details   12/20/19 0051 12/24/19 1038   12/21/19 1000  remdesivir 100 mg in sodium chloride 0.9 % 100 mL IVPB  Status:  Discontinued       "Followed by" Linked Group Details   12/20/19 0157 12/20/19 0201   12/20/19 0200  remdesivir 200 mg in sodium chloride 0.9% 250 mL IVPB  Status:  Discontinued       "Followed by" Linked Group Details   12/20/19 0157 12/20/19 0201   12/20/19 0100  remdesivir 200 mg in sodium chloride 0.9% 250 mL IVPB       "Followed by" Linked Group Details   12/20/19 0051 12/20/19 0320       Discharge Exam: Vitals:   01/01/20 1521 01/01/20 2053 01/02/20 0516 01/02/20 0736  BP: 132/67 132/82 (!) 144/83 118/65  Pulse: 96 (!) 105 92 85  Resp: 20  17 18   Temp: 97.8 F (36.6 C) 97.9 F (36.6 C) 97.7 F (36.5 C) (!) 97.4 F (36.3 C)  TempSrc: Oral Oral Oral Oral  SpO2: 92% 96% 96% 100%  Weight:   132.5 kg   Height:        General: A/O x4, positive acute respiratory distress Eyes: negative scleral hemorrhage, negative anisocoria, negative icterus ENT: Negative  Runny nose, negative gingival bleeding, Neck:  Negative scars, masses, torticollis, lymphadenopathy, JVD Lungs: poor breath sounds bilaterally without wheezes or crackles Cardiovascular: Regular rate and rhythm without murmur gallop or rub normal S1 and S2  Discharge Instructions  Discharge Instructions    Diet - low sodium heart healthy   Complete by: As directed    Discharge instructions   Complete by: As directed    ?   Person Under Monitoring Name: [name]  Location: [address] [place]   Infection Prevention Recommendations for Individuals Confirmed to have, or Being Evaluated for, 2019 Novel Coronavirus (COVID-19) Infection Who Receive Care at Home  Individuals who are confirmed to have, or are being evaluated for, COVID-19 should follow the prevention steps below until a healthcare provider or local or state health department says they can return to normal activities.  Stay home except to get medical care You should restrict activities outside your home, except for getting medical care. Do not go  to work, school, or public areas, and do not use public transportation or taxis.  Call ahead before visiting your doctor Before your medical appointment, call the healthcare provider and tell them that you have, or are being evaluated for, COVID-19 infection. This will help the healthcare provider's office take steps to keep other people from getting infected. Ask your healthcare provider to call the local or state health department.  Monitor your symptoms Seek prompt medical attention if your illness is worsening (e.g., difficulty breathing). Before going to your medical appointment, call the healthcare provider and tell them that you have, or are being evaluated for, COVID-19 infection. Ask your healthcare provider to call the local or state health department.  Wear a facemask You should wear a facemask that covers your nose and mouth when you  are in the same room with other people and when you visit a healthcare provider. People who live with or visit you should also wear a facemask while they are in the same room with you.  Separate yourself from other people in your home As much as possible, you should stay in a different room from other people in your home. Also, you should use a separate bathroom, if available.  Avoid sharing household items You should not share dishes, drinking glasses, cups, eating utensils, towels, bedding, or other items with other people in your home. After using these items, you should wash them thoroughly with soap and water.  Cover your coughs and sneezes Cover your mouth and nose with a tissue when you cough or sneeze, or you can cough or sneeze into your sleeve. Throw used tissues in a lined trash can, and immediately wash your hands with soap and water for at least 20 seconds or use an alcohol-based hand rub.  Wash your [employer]hands Wash your hands often and thoroughly with soap and water for at least 20 seconds. You can use an alcohol-based hand sanitizer if soap and water are not available and if your hands are not visibly dirty. Avoid touching your eyes, nose, and mouth with unwashed hands.   Prevention Steps for Caregivers and Household Members of Individuals Confirmed to have, or Being Evaluated for, COVID-19 Infection Being Cared for in the Home  If you live with, or provide care at home for, a person confirmed to have, or being evaluated for, COVID-19 infection please follow these guidelines to prevent infection:  Follow healthcare provider's instructions Make sure that you understand and can help the patient follow any healthcare provider instructions for all care.  Provide for the patient's basic needs You should help the patient with basic needs in the home and provide support for getting groceries, prescriptions, and other personal needs.  Monitor the patient's symptoms If they are  getting sicker, call his or her medical provider and tell them that the patient has, or is being evaluated for, COVID-19 infection. This will help the healthcare provider's office take steps to keep other people from getting infected. Ask the healthcare provider to call the local or state health department.  Limit the number of people who have contact with the patient If possible, have only one caregiver for the patient. Other household members should stay in another home or place of residence. If this is not possible, they should stay in another room, or be separated from the patient as much as possible. Use a separate bathroom, if available. Restrict visitors who do not have an essential need to be in the home.  Keep older adults,  very young children, and other sick people away from the patient Keep older adults, very young children, and those who have compromised immune systems or chronic health conditions away from the patient. This includes people with chronic heart, lung, or kidney conditions, diabetes, and cancer.  Ensure good ventilation Make sure that shared spaces in the home have good air flow, such as from an air conditioner or an opened window, weather permitting.  Wash your hands often Wash your hands often and thoroughly with soap and water for at least 20 seconds. You can use an alcohol based hand sanitizer if soap and water are not available and if your hands are not visibly dirty. Avoid touching your eyes, nose, and mouth with unwashed hands. Use disposable paper towels to dry your hands. If not available, use dedicated cloth towels and replace them when they become wet.  Wear a facemask and gloves Wear a disposable facemask at all times in the room and gloves when you touch or have contact with the patient's blood, body fluids, and/or secretions or excretions, such as sweat, saliva, sputum, nasal mucus, vomit, urine, or feces.  Ensure the mask fits over your nose and mouth  tightly, and do not touch it during use. Throw out disposable facemasks and gloves after using them. Do not reuse. Wash your hands immediately after removing your facemask and gloves. If your personal clothing becomes contaminated, carefully remove clothing and launder. Wash your hands after handling contaminated clothing. Place all used disposable facemasks, gloves, and other waste in a lined container before disposing them with other household waste. Remove gloves and wash your hands immediately after handling these items.  Do not share dishes, glasses, or other household items with the patient Avoid sharing household items. You should not share dishes, drinking glasses, cups, eating utensils, towels, bedding, or other items with a patient who is confirmed to have, or being evaluated for, COVID-19 infection. After the person uses these items, you should wash them thoroughly with soap and water.  Wash laundry thoroughly Immediately remove and wash clothes or bedding that have blood, body fluids, and/or secretions or excretions, such as sweat, saliva, sputum, nasal mucus, vomit, urine, or feces, on them. Wear gloves when handling laundry from the patient. Read and follow directions on labels of laundry or clothing items and detergent. In general, wash and dry with the warmest temperatures recommended on the label.  Clean all areas the individual has used often Clean all touchable surfaces, such as counters, tabletops, doorknobs, bathroom fixtures, toilets, phones, keyboards, tablets, and bedside tables, every day. Also, clean any surfaces that may have blood, body fluids, and/or secretions or excretions on them. Wear gloves when cleaning surfaces the patient has come in contact with. Use a diluted bleach solution (e.g., dilute bleach with 1 part bleach and 10 parts water) or a household disinfectant with a label that says EPA-registered for coronaviruses. To make a bleach solution at home, add 1  tablespoon of bleach to 1 quart (4 cups) of water. For a larger supply, add  cup of bleach to 1 gallon (16 cups) of water. Read labels of cleaning products and follow recommendations provided on product labels. Labels contain instructions for safe and effective use of the cleaning product including precautions you should take when applying the product, such as wearing gloves or eye protection and making sure you have good ventilation during use of the product. Remove gloves and wash hands immediately after cleaning.  Monitor yourself for signs and symptoms of  illness Caregivers and household members are considered close contacts, should monitor their health, and will be asked to limit movement outside of the home to the extent possible. Follow the monitoring steps for close contacts listed on the symptom monitoring form.   ? If you have additional questions, contact your local health department or call the epidemiologist on call at [phone] (available 24/7). ? This guidance is subject to change. For the most up-to-date guidance from CDC, please refer to their website: [employer]   Increase activity slowly   Complete by: As directed      Allergies as of 01/02/2020   No Known Allergies     Medication List    TAKE these medications   albuterol 108 (90 Base) MCG/ACT inhaler Commonly known as: VENTOLIN HFA Inhale 2 puffs into the lungs every 6 (six) hours.   amLODipine 10 MG tablet Commonly known as: NORVASC Take 1 tablet (10 mg total) by mouth daily.   apixaban 5 MG Tabs tablet Commonly known as: ELIQUIS Take 2 tablets (10 mg total) by mouth 2 (two) times daily.   apixaban 5 MG Tabs tablet Commonly known as: ELIQUIS Take 1 tablet (5 mg total) by mouth 2 (two) times daily. Start taking on: January 08, 2020   ascorbic acid 500 MG tablet Commonly known as: VITAMIN C Take 1 tablet (500 mg total) by mouth daily.    atorvastatin 20 MG tablet Commonly known as: LIPITOR Take 1 tablet (20 mg total) by mouth daily.   fluconazole 100 MG tablet Commonly known as: DIFLUCAN Take 1 tablet (100 mg total) by mouth daily at 6 PM.   insulin detemir 100 unit/ml Soln Commonly known as: LEVEMIR Inject 0.3 mLs (30 Units total) into the skin daily.   Insulin Pen Needle 31G X 8 MM Misc Use as directed   linagliptin 5 MG Tabs tablet Commonly known as: TRADJENTA Take 1 tablet (5 mg total) by mouth daily.   magic mouthwash w/lidocaine Soln Take 5 mLs by mouth 4 (four) times daily as needed for mouth pain.   melatonin 5 MG Tabs Take 1 tablet (5 mg total) by mouth at bedtime.   menthol-cetylpyridinium 3 MG lozenge Commonly known as: CEPACOL Take 1 lozenge (3 mg total) by mouth as needed for sore throat.   metoprolol tartrate 25 MG tablet Commonly known as: LOPRESSOR Take 0.5 tablets (12.5 mg total) by mouth 2 (two) times daily.   multivitamin with minerals Tabs tablet Take 1 tablet by mouth daily.   NovoLOG FlexPen 100 UNIT/ML FlexPen Generic drug: insulin aspart Inject 10 Units into the skin 3 (three) times daily with meals.   nystatin 100000 UNIT/ML suspension Commonly known as: MYCOSTATIN Take 5 mLs by mouth 4 (four) times daily.   ondansetron 4 MG tablet Commonly known as: ZOFRAN Take 1 tablet (4 mg total) by mouth every 6 (six) hours as needed for nausea.   oxymetazoline 0.05 % nasal spray Commonly known as: AFRIN Place 1 spray into both nostrils 2 (two) times daily.   phenol 1.4 % Liqd Commonly known as: CHLORASEPTIC Use as directed 1 spray in the mouth or throat as needed for throat irritation / pain.   spironolactone 25 MG tablet Commonly known as: ALDACTONE Take 1 tablet (25 mg total) by mouth daily.   Vitamin D3 25 MCG tablet Commonly known as: Vitamin D Take 1 tablet (1,000 Units total) by mouth daily.   zinc sulfate 220 (50 Zn) MG capsule Take 1 capsule (220 mg total) by  mouth daily.            Durable Medical Equipment  (From admission, onward)         Start     Ordered   01/01/20 1500  For home use only DME oxygen  Once       Comments: SATURATION QUALIFICATIONS: (This note is used to comply with regulatory documentation for home oxygen) Patient Saturations on Room Air at Rest = 88% Patient Saturations on Room Air while Ambulating = 80% Patient Saturations on 3 Liters of oxygen while Ambulating = 93% Please briefly explain why patient needs home oxygen: Patient's oxygen saturation dropped below 88% with minimal exertion on room air. -Patient meets criteria for home O2 -3 L O2; titrate to maintain SPO2> 88% -Provide Inogen home portable O2 concentrator  Question Answer Comment  Length of Need Lifetime   Mode or (Route) Nasal cannula   Liters per Minute 3   Frequency Continuous (stationary and portable oxygen unit needed)   Oxygen conserving device Yes   Oxygen delivery system Gas      01/01/20 1500   12/27/19 1959  For home use only DME oxygen  Once       Comments: POC EVALUATION  Question Answer Comment  Length of Need 6 Months   Mode or (Route) Nasal cannula   Oxygen conserving device Yes   Oxygen delivery system Other see comments      12/27/19 1958         No Known Allergies  Follow-up Information    Linus Salmons, MD Follow up.   Specialty: Otolaryngology Contact information: [address] [place] [address] [phone]                The results of significant diagnostics from this hospitalization (including imaging, microbiology, ancillary and laboratory) are listed below for reference.    Significant Diagnostic Studies: DG Chest 1 View  Result Date: 12/19/2019 CLINICAL DATA:  Fever, cough, COVID EXAM: CHEST  1 VIEW COMPARISON:  None. FINDINGS: Heart is normal size. Patchy bilateral airspace opacities. No effusions or pneumothorax. No acute bony abnormality. IMPRESSION: Patchy  bilateral airspace disease compatible with COVID pneumonia. Electronically Signed   By: Charlett Nose M.D.   On: 12/19/2019 23:20   CT ANGIO CHEST PE W OR WO CONTRAST  Addendum Date: 12/31/2019   ADDENDUM REPORT: 12/31/2019 13:57 ADDENDUM: Critical Value/emergent results were called by telephone at the time of interpretation on 12/31/2019 at 1:55 pm to provider Gwenyth Dingee , who verbally acknowledged these results. Electronically Signed   By: Carey Bullocks M.D.   On: 12/31/2019 13:57   Result Date: 12/31/2019 CLINICAL DATA:  COVID-19 infection with progressive shortness of breath. Evaluate for pulmonary embolism. EXAM: CT ANGIOGRAPHY CHEST WITH CONTRAST TECHNIQUE: Multidetector CT imaging of the chest was performed using the standard protocol during bolus administration of intravenous contrast. Multiplanar CT image reconstructions and MIP's were obtained to evaluate the vascular anatomy. CONTRAST:  OMNIPAQUE IOHEXOL 350 MG/ML SOLN COMPARISON:  Radiographs today and 12/19/2019. Chest CTA 12/25/2019. FINDINGS: Cardiovascular: The pulmonary arteries are well opacified with contrast to the level of the subsegmental branches. There is small volume acute thromboembolic disease involving segmental branches in the left lower lobe, best seen on coronal images 63 through 66 of series 7. No central or right-sided pulmonary emboli are seen. No significant systemic arterial abnormalities. The heart size is normal. There is no pericardial effusion. Mediastinum/Nodes: There are no enlarged mediastinal, hilar or axillary  lymph nodes.Previously demonstrated pneumomediastinum has resolved. The thyroid gland, trachea and esophagus demonstrate no significant findings. Lungs/Pleura: There is no pleural effusion or pneumothorax. Extensive ground-glass opacities throughout both lungs are again noted, similar to the previous study and consistent with viral pneumonia. There is slightly increased consolidation within the lingula.  Upper abdomen: Decreased hepatic density suspicious for steatosis. There is wedge-shaped low-density within the spleen which may reflect a small splenic infarct. The visualized adrenal glands are unremarkable. Musculoskeletal/Chest wall: There is no chest wall mass or suspicious osseous finding. Review of the MIP images confirms the above findings. IMPRESSION: 1. Small volume acute thromboembolic disease involving segmental branches in the left lower lobe. No central or right-sided pulmonary emboli. 2. Extensive ground-glass opacities throughout both lungs, similar to the previous study and consistent with known COVID-19 pneumonia. There is slightly increased consolidation within the lingula. 3. Resolution of previously demonstrated pneumomediastinum. No pneumothorax. 4. Possible small splenic infarct. Electronically Signed: By: Carey Bullocks M.D. On: 12/31/2019 13:46   CT ANGIO CHEST PE W OR WO CONTRAST  Result Date: 12/25/2019 CLINICAL DATA:  Worsening hypoxia, COVID-19 positive EXAM: CT ANGIOGRAPHY CHEST WITH CONTRAST TECHNIQUE: Multidetector CT imaging of the chest was performed using the standard protocol during bolus administration of intravenous contrast. Multiplanar CT image reconstructions and MIPs were obtained to evaluate the vascular anatomy. CONTRAST:  55mL OMNIPAQUE IOHEXOL 350 MG/ML SOLN COMPARISON:  None. FINDINGS: Cardiovascular: Satisfactory opacification of the pulmonary arteries to the segmental level. No evidence of pulmonary embolism. Normal heart size. No pericardial effusion. Mediastinum/Nodes: No enlarged mediastinal, hilar, or axillary lymph nodes. Thyroid gland, trachea, and esophagus demonstrate no significant findings. Extensive pneumomediastinum dissecting into the neck, right worse than left. Lungs/Pleura: No pleural effusion or pneumothorax. Bilateral patchy ground-glass opacities and airspace disease throughout the lungs consistent with patient's known history of COVID-19  pneumonia. Upper Abdomen: No acute abnormality. Musculoskeletal: No chest wall abnormality. No acute or significant osseous findings. Review of the MIP images confirms the above findings. IMPRESSION: 1. No evidence of pulmonary embolus. 2. Extensive pneumomediastinum dissecting into the neck, right worse than left. 3. Bilateral patchy ground-glass opacities and airspace disease throughout the lungs consistent with patient's known history of COVID-19 pneumonia. Electronically Signed   By: Elige Ko   On: 12/25/2019 15:04   DG Chest Port 1 View  Result Date: 12/31/2019 CLINICAL DATA:  Fever, cough, shortness of breath, COVID positive EXAM: PORTABLE CHEST 1 VIEW COMPARISON:  CT chest dated 12/25/2019 FINDINGS: Multifocal patchy opacities in the lungs bilaterally. No pleural effusion or pneumothorax. Heart is normal in size. IMPRESSION: Multifocal pneumonia in this patient with known COVID. Electronically Signed   By: Charline Bills M.D.   On: 12/31/2019 07:39    Microbiology: No results found for this or any previous visit (from the past 240 hour(s)).   Labs: Basic Metabolic Panel: Recent Labs  Lab 12/28/19 0635 12/28/19 5573 12/29/19 0618 12/30/19 0659 12/31/19 0500 01/01/20 0810 01/02/20 0710  NA 130*  --  130* 131* 131* 135  --   K 4.2  --  4.1 3.6 3.6 3.9  --   CL 94*  --  95* 93* 91* 98  --   CO2 26  --  24 26 28 28   --   GLUCOSE 301*  --  186* 112* 200* 96  --   BUN 27*  --  23* 20 29* 25*  --   CREATININE 1.12  --  0.98 1.02 1.08 1.02  --   CALCIUM  8.6*  --  8.9 8.6* 8.8* 8.7*  --   MG 2.3   < > 2.1 2.0 2.2 2.5* 2.5*  PHOS 3.4   < > 4.5 4.0 4.1 4.1 4.4   < > = values in this interval not displayed.   Liver Function Tests: Recent Labs  Lab 12/28/19 0635 12/29/19 0618 12/30/19 0659 12/31/19 0500 01/01/20 0810  AST 30 26 28 22 19   ALT 80* 70* 56* 51* 45*  ALKPHOS 67 57 54 53 48  BILITOT 0.9 0.9 0.9 0.7 0.7  PROT 6.9 7.3 7.4 7.1 6.9  ALBUMIN 3.0* 3.3* 3.1* 2.9* 3.1*    No results for input(s): LIPASE, AMYLASE in the last 168 hours. No results for input(s): AMMONIA in the last 168 hours. CBC: Recent Labs  Lab 12/29/19 0618 12/30/19 0659 12/31/19 0500 01/01/20 0810 01/02/20 0710  WBC 12.2* 12.3* 12.9* 11.9* 13.0*  NEUTROABS 10.5* 9.3* 10.4* 8.8* 9.7*  HGB 14.6 15.1 14.6 14.1 14.0  HCT 43.3 43.9 42.1 40.5 41.0  MCV 82.0 81.1 78.8* 79.9* 80.4  PLT 298 310 311 364 417*   Cardiac Enzymes: No results for input(s): CKTOTAL, CKMB, CKMBINDEX, TROPONINI in the last 168 hours. BNP: BNP (last 3 results) Recent Labs    12/20/19 0109  BNP 22.3    ProBNP (last 3 results) No results for input(s): PROBNP in the last 8760 hours.  CBG: Recent Labs  Lab 12/31/19 1659 12/31/19 2056 01/01/20 0735 01/01/20 1140 01/01/20 2054  GLUCAP 176* 169* 100* 227* 196*       Signed:  Carolyne Littles, MD [facility] [phone] pager

## 2020-01-02 NOTE — Progress Notes (Signed)
This RN was at bedside with MD Joseph Art while discussion on discharge occurred. Patient refusing discharge at this time. Says he "can't walk to bathroom", This RN witnessed patient walking from bathroom to Recliner this AM. Patient stated SHOB at that time; his 02 tank was empty. I connected him to wall 02 and stats were above 90%. When MD Joseph Art told him he may be on 02 life ling, Patient changed subject back to constipation, when constipation medication discussed patient again turned conversation to walking in room....endless cycle of argument going nowhere near a resolution. MD notified patient his next step was to challenge discharge with Case Manager and informed patient no additional MDs would be in to assess patient as he has been deemed stable to discharge by MD Joseph Art and MD Karna Christmas. All MD notes are documented related to patient's stability to discharge. This RN has contacted Case Manager Bridget Cobb to start Challenge process.

## 2020-01-02 NOTE — Progress Notes (Signed)
Patient did not deny Sport and exercise psychologist taught diabetic education last week. Would not answer me whether he wanted additional teaching with another kit. Still will not give cc# for oxygen co pay. States he will not go home without oxygen and should not be "stressed" over providing payment info while he is in the hospital. Will not schedule someone to pick him up at this time because he does not have oxygen to take home.

## 2020-01-02 NOTE — Progress Notes (Addendum)
Patient in recliner during my initial visit into room this morning. 02 tank was empty and patient had been sitting in bathroom for 10 minutes waiting on assistance before independently moving himself to recliner. This RN removed empty 02 tank and replaced with Full tank. Reassured 02 connection to wall unit at 3L.   Complains of constipation. MD Joseph Art ordered Milk of Mag rectal suppository for patient's constipation. This RN will administer at ordered time today 12pm IF PATIENT AGREES. Patient wanted to try prune juice before suppository today.  This RN requested prune juice from nutritional services and administered 2 cups of prune juice to patient per his request to help with constipation. Says he has never had this issue in the past.  Report from night shift RN Desiree to me said patient does not desire to go home at this time due to lack of support at home. Patient reports the same to me this morning.Patient reported to me social worker called to discuss discharge plan and he reports he has not spoken to MD today and no one has discusses him going home. Says he feels unsafe to go home as he was [facility] in bathroom this morning. SHOB occurred at the time his 02 tank was empty.   I will continue to monitor his 02 levels as well as psychosocial behavior.

## 2020-01-02 NOTE — TOC Progression Note (Addendum)
Transition of Care [facility]) - Progression Note    Patient Details  Name: [name] MRN: [id number] Date of Birth: 1971/02/02  Transition of Care [facility]) CM/SW Contact  Maree Krabbe, LCSW Phone Number: 01/02/2020, 1:48 PM  Clinical Narrative:  Pt is refusing d/c however CSW spoke with [facility] director and he states since pt is not a medicare pt he does not have the option to appeal. Therefore the pt is d/c and the Nursing Director will choose how to d/c pt. Nursing Director [name] notified, RN and Consulting civil engineer notified.   Zach with [employer] is trying to reach pt in the room to get card information in order to supply 02. Pt will have copay.  Pt is refusing to give [employer] his credit card information. Therefore pt can not get 02.         Expected Discharge Plan and Services           Expected Discharge Date: 01/02/20                                     Social Determinants of Health (SDOH) Interventions    Readmission Risk Interventions No flowsheet data found.

## 2020-01-03 ENCOUNTER — Other Ambulatory Visit: Payer: Self-pay | Admitting: Internal Medicine

## 2020-01-03 LAB — GLUCOSE, CAPILLARY
Glucose-Capillary: 100 mg/dL — ABNORMAL HIGH (ref 70–99)
Glucose-Capillary: 173 mg/dL — ABNORMAL HIGH (ref 70–99)

## 2020-01-03 MED ORDER — ZINC SULFATE 220 (50 ZN) MG PO CAPS
220.0000 mg | ORAL_CAPSULE | Freq: Every day | ORAL | 0 refills | Status: DC
Start: 2020-01-03 — End: 2020-01-03

## 2020-01-03 MED ORDER — PHENOL 1.4 % MT LIQD: 1.0000 | OROMUCOSAL | 0 refills | Status: AC | PRN

## 2020-01-03 MED ORDER — ONDANSETRON HCL 4 MG PO TABS: 4.0000 mg | ORAL_TABLET | Freq: Four times a day (QID) | ORAL | 0 refills | Status: AC | PRN

## 2020-01-03 MED ORDER — AMLODIPINE BESYLATE 10 MG PO TABS
10.0000 mg | ORAL_TABLET | Freq: Every day | ORAL | 0 refills | Status: DC
Start: 2020-01-03 — End: 2020-02-12

## 2020-01-03 MED ORDER — INSULIN STARTER KIT- PEN NEEDLES (ENGLISH): 1.0000 | Freq: Once | 0 refills | Status: DC

## 2020-01-03 MED ORDER — NYSTATIN 100000 UNIT/ML MT SUSP: 5.0000 mL | Freq: Four times a day (QID) | OROMUCOSAL | 0 refills | Status: AC

## 2020-01-03 MED ORDER — NOVOLOG FLEXPEN 100 UNIT/ML ~~LOC~~ SOPN: 10.0000 [IU] | PEN_INJECTOR | Freq: Three times a day (TID) | SUBCUTANEOUS | 0 refills | Status: DC

## 2020-01-03 MED ORDER — MELATONIN 5 MG PO TABS
5.0000 mg | ORAL_TABLET | Freq: Every day | ORAL | 0 refills | Status: DC
Start: 2020-01-03 — End: 2020-01-03

## 2020-01-03 MED ORDER — ATORVASTATIN CALCIUM 20 MG PO TABS
20.0000 mg | ORAL_TABLET | Freq: Every day | ORAL | 0 refills | Status: DC
Start: 2020-01-03 — End: 2020-01-03

## 2020-01-03 MED ORDER — NOVOLOG FLEXPEN 100 UNIT/ML ~~LOC~~ SOPN: 10.0000 [IU] | PEN_INJECTOR | Freq: Three times a day (TID) | SUBCUTANEOUS | 0 refills | Status: AC

## 2020-01-03 MED ORDER — APIXABAN 5 MG PO TABS
5.0000 mg | ORAL_TABLET | Freq: Two times a day (BID) | ORAL | 0 refills | Status: DC
Start: 2020-01-08 — End: 2020-02-12

## 2020-01-03 MED ORDER — AMLODIPINE BESYLATE 10 MG PO TABS
10.0000 mg | ORAL_TABLET | Freq: Every day | ORAL | 0 refills | Status: DC
Start: 2020-01-03 — End: 2020-01-03

## 2020-01-03 MED ORDER — ZINC SULFATE 220 (50 ZN) MG PO CAPS: 220.0000 mg | ORAL_CAPSULE | Freq: Every day | ORAL | 0 refills | Status: AC

## 2020-01-03 MED ORDER — ALBUTEROL SULFATE HFA 108 (90 BASE) MCG/ACT IN AERS: 2.0000 | INHALATION_SPRAY | Freq: Four times a day (QID) | RESPIRATORY_TRACT | 0 refills | Status: AC

## 2020-01-03 MED ORDER — PHENOL 1.4 % MT LIQD: 1.0000 | OROMUCOSAL | 0 refills | Status: DC | PRN

## 2020-01-03 MED ORDER — MELATONIN 5 MG PO TABS: 5.0000 mg | ORAL_TABLET | Freq: Every day | ORAL | 0 refills | Status: DC

## 2020-01-03 MED ORDER — SALINE SPRAY 0.65 % NA SOLN: 1.0000 | NASAL | 0 refills | Status: AC | PRN

## 2020-01-03 MED ORDER — SPIRONOLACTONE 25 MG PO TABS: 25.0000 mg | ORAL_TABLET | Freq: Every day | ORAL | 0 refills | Status: DC

## 2020-01-03 MED ORDER — VITAMIN D3 25 MCG PO TABS: 1000.0000 [IU] | ORAL_TABLET | Freq: Every day | ORAL | 0 refills | Status: DC

## 2020-01-03 MED ORDER — LINAGLIPTIN 5 MG PO TABS: 5.0000 mg | ORAL_TABLET | Freq: Every day | ORAL | 0 refills | Status: AC

## 2020-01-03 MED ORDER — MENTHOL 3 MG MT LOZG: 1.0000 | LOZENGE | OROMUCOSAL | 0 refills | Status: DC | PRN

## 2020-01-03 MED ORDER — LINAGLIPTIN 5 MG PO TABS
5.0000 mg | ORAL_TABLET | Freq: Every day | ORAL | 0 refills | Status: DC
Start: 2020-01-03 — End: 2020-01-03

## 2020-01-03 MED ORDER — ATORVASTATIN CALCIUM 20 MG PO TABS: 20.0000 mg | ORAL_TABLET | Freq: Every day | ORAL | 0 refills | Status: AC

## 2020-01-03 MED ORDER — INSULIN PEN NEEDLE 31G X 8 MM MISC: 0 refills | Status: AC

## 2020-01-03 MED ORDER — ADULT MULTIVITAMIN W/MINERALS CH: 1.0000 | ORAL_TABLET | Freq: Every day | ORAL | 0 refills | Status: DC

## 2020-01-03 MED ORDER — METOPROLOL TARTRATE 25 MG PO TABS: 12.5000 mg | ORAL_TABLET | Freq: Two times a day (BID) | ORAL | 0 refills | Status: AC

## 2020-01-03 MED ORDER — OXYMETAZOLINE HCL 0.05 % NA SOLN: 1.0000 | Freq: Two times a day (BID) | NASAL | 0 refills | Status: AC

## 2020-01-03 MED ORDER — INSULIN PEN NEEDLE 31G X 8 MM MISC: 0 refills | Status: DC

## 2020-01-03 MED ORDER — FLUCONAZOLE 100 MG PO TABS: 100.0000 mg | ORAL_TABLET | Freq: Every day | ORAL | 0 refills | Status: AC

## 2020-01-03 MED ORDER — SPIRONOLACTONE 25 MG PO TABS
25.0000 mg | ORAL_TABLET | Freq: Every day | ORAL | 0 refills | Status: DC
Start: 2020-01-03 — End: 2020-01-03

## 2020-01-03 MED ORDER — ADULT MULTIVITAMIN W/MINERALS CH: 1.0000 | ORAL_TABLET | Freq: Every day | ORAL | 0 refills | Status: AC

## 2020-01-03 MED ORDER — ALBUTEROL SULFATE HFA 108 (90 BASE) MCG/ACT IN AERS: 2.0000 | INHALATION_SPRAY | Freq: Four times a day (QID) | RESPIRATORY_TRACT | 0 refills | Status: DC

## 2020-01-03 MED ORDER — OXYMETAZOLINE HCL 0.05 % NA SOLN: 1.0000 | Freq: Two times a day (BID) | NASAL | 0 refills | Status: DC

## 2020-01-03 MED ORDER — NYSTATIN 100000 UNIT/ML MT SUSP: 5.0000 mL | Freq: Four times a day (QID) | OROMUCOSAL | 0 refills | Status: DC

## 2020-01-03 MED ORDER — INSULIN DETEMIR 100 UNIT/ML FLEXPEN: 30.0000 [IU] | Freq: Every day | SUBCUTANEOUS | 0 refills | Status: AC

## 2020-01-03 MED ORDER — APIXABAN 5 MG PO TABS
10.0000 mg | ORAL_TABLET | Freq: Two times a day (BID) | ORAL | 0 refills | Status: DC
Start: 2020-01-03 — End: 2020-01-03

## 2020-01-03 MED ORDER — ASCORBIC ACID 500 MG PO TABS
500.0000 mg | ORAL_TABLET | Freq: Every day | ORAL | 0 refills | Status: DC
Start: 2020-01-03 — End: 2020-01-03

## 2020-01-03 MED ORDER — MENTHOL 3 MG MT LOZG: 1.0000 | LOZENGE | OROMUCOSAL | 0 refills | Status: AC | PRN

## 2020-01-03 MED ORDER — FLUCONAZOLE 100 MG PO TABS
100.0000 mg | ORAL_TABLET | Freq: Every day | ORAL | 0 refills | Status: DC
Start: 2020-01-03 — End: 2020-01-03

## 2020-01-03 MED ORDER — VITAMIN D3 25 MCG PO TABS: 1000.0000 [IU] | ORAL_TABLET | Freq: Every day | ORAL | 0 refills | Status: AC

## 2020-01-03 MED ORDER — INSULIN STARTER KIT- PEN NEEDLES (ENGLISH): 1.0000 | Freq: Once | 0 refills | Status: AC

## 2020-01-03 MED ORDER — APIXABAN 5 MG PO TABS: 10.0000 mg | ORAL_TABLET | Freq: Two times a day (BID) | ORAL | 0 refills | Status: AC

## 2020-01-03 MED ORDER — METOPROLOL TARTRATE 25 MG PO TABS
12.5000 mg | ORAL_TABLET | Freq: Two times a day (BID) | ORAL | 0 refills | Status: DC
Start: 2020-01-03 — End: 2020-01-03

## 2020-01-03 MED ORDER — ASCORBIC ACID 500 MG PO TABS: 500.0000 mg | ORAL_TABLET | Freq: Every day | ORAL | 0 refills | Status: AC

## 2020-01-03 MED ORDER — INSULIN DETEMIR 100 UNIT/ML FLEXPEN
30.0000 [IU] | Freq: Every day | SUBCUTANEOUS | 0 refills | Status: DC
Start: 2020-01-03 — End: 2020-01-03

## 2020-01-03 MED ORDER — APIXABAN 5 MG PO TABS
5.0000 mg | ORAL_TABLET | Freq: Two times a day (BID) | ORAL | 0 refills | Status: DC
Start: 2020-01-08 — End: 2020-01-03

## 2020-01-03 MED ORDER — ONDANSETRON HCL 4 MG PO TABS: 4.0000 mg | ORAL_TABLET | Freq: Four times a day (QID) | ORAL | 0 refills | Status: DC | PRN

## 2020-01-03 NOTE — Progress Notes (Signed)
Asked pt if he would like enema this Am. Pt refused. No further complaints. VSS. Will continue to monitor.

## 2020-01-03 NOTE — TOC Progression Note (Signed)
Transition of Care [facility]) - Progression Note    Patient Details  Name: [name] MRN: [id number] Date of Birth: 03-May-1970  [facility]) CM/SW Contact  Maree Krabbe, LCSW Phone Number: 01/03/2020, 12:04 PM  Clinical Narrative:    Per zach with [employer] 02 has been delievered.        Expected Discharge Plan and Services           Expected Discharge Date: 01/02/20                                     Social Determinants of Health (SDOH) Interventions    Readmission Risk Interventions No flowsheet data found.

## 2020-01-03 NOTE — Progress Notes (Signed)
Patient had a bowel movement on Tuesday evening at 1830 prior to shift change. I stood by patient as he independently ambulated into bathroom with portable oxygen. He called me back into room a few minutes later stating he had an accident. His underwear were soiled with stool in the bathroom floor. I gave him a new pair of underwear out of his personal bag and wipes to clean himself. Bowel movement was in underwear and some in toilet.

## 2020-01-03 NOTE — Progress Notes (Signed)
Patient discharged to home with family.  Sent home with oxygen and educated on use of machines.  Verbalized understanding of discharge instructions.  Verbalized understanding to stay quarantined until 9/14 as per orders

## 2020-02-12 ENCOUNTER — Encounter: Payer: Commercial Managed Care - PPO | Attending: Gerontology | Admitting: Dietician

## 2020-02-12 ENCOUNTER — Other Ambulatory Visit: Payer: Self-pay

## 2020-02-12 ENCOUNTER — Encounter: Payer: Self-pay | Admitting: Dietician

## 2020-02-12 VITALS — Ht 72.0 in | Wt 310.5 lb

## 2020-02-12 DIAGNOSIS — E119 Type 2 diabetes mellitus without complications: Secondary | ICD-10-CM | POA: Insufficient documentation

## 2020-02-12 NOTE — Progress Notes (Signed)
Medical Nutrition Therapy: Visit start time: 0940  end time: 1045  Assessment:  Diagnosis: Type 2 diabetes Past medical history: hypertension; currently on supplemental oxygen following COVID Psychosocial issues/ stress concerns: none  Preferred learning method:  . Auditory   Current weight: 310.5lbs Height: 6'0" Medications, supplements: reconciled list in medical record  Progress and evaluation:   Patient states he needs help with options for meals, meal planning more so than basic diabetes information.   He checks BGs before each meals; reports fasting ranging 160s, varying results the rest of the day. Highest recent BG was about 200. Denies any low BGs.   He reports making positive diet changes to improve BG control as well as blood pressure; consuming some veg-fruit smoothies, limiting processed foods and carbohydrates.  Started on insulin in hosp when there for COVID  Physical activity: limited due to current oxygen use, recovering from COVID  Dietary Intake:  Usual eating pattern includes 3 meals and 0-2 snacks per day. Dining out frequency: 3 meals per week.  Breakfast:9-10am  tea with honey and lemon; yogurt with blueberries and boiled egg or scramb with sausage; milk; green smoothie with spinach, arugula, parsely and berries, flax seeds Snack: maybe sandwich or similar Lunch: 2-3pm -- sandwich; salad; occ leftovers ie chicken Snack: same as am or none Supper: 6-8pm -- chicken/ fish/ turkey/ pork + veg ie collards, spinach, other + whole wheat pasta or regular/ occ potato; had small portion pizza recently Snack: same as am or none Beverages: mostly water, green tea, limits sodas but occ sips  Nutrition Care Education: Topics covered:  Basic nutrition: basic food groups, appropriate nutrient balance, appropriate meal and snack schedule, general nutrition guidelines    Advanced nutrition:  cooking techniques/ recipes Diabetes:  appropriate meal and snack schedule,  appropriate carb intake and balance, healthy carb choices, role of fiber, protein, fat; basic meal planning using plate planner; appropriate food portions Hypertension: identifying high sodium foods, Mediterranean eating pattern for BP and BG control   Nutritional Diagnosis:  [place]-2.2 Altered nutrition-related laboratory As related to diabetes.  As evidenced by recent HbA1C of 9.5%. -3.3 Overweight/obesity As related to history of excess calories and inadequate physical activity.  As evidenced by patient with current BMI of 41.8, making diet changes to promote controlled diabetes and blood pressure and weight loss.  Intervention:  . Instruction and discussion as noted above. . Patient has been working on positive diet and lifestyle changes, and is motivated to continue.  . Provided written resources and online resources to further help him with changes and meal/ recipe ideas.  . No follow-up scheduled at this time; patient will schedule later if needed.  Education Materials given:  . Plate Planner . Sample meal pattern  . Sample menus . Diabetes recipe and menu ideas . Goals/ instructions   Learner/ who was taught:  . Patient    Level of understanding: Marland Kitchen Verbalizes/ demonstrates competency  Demonstrated degree of understanding via:   Teach back Learning barriers: . None  Willingness to learn/ readiness for change: . Eager, change in progress   Monitoring and Evaluation:  Dietary intake, exercise, BG control, and body weight      follow up: prn

## 2020-02-12 NOTE — Patient Instructions (Signed)
   Can check into [employer] app for more recipe/ meal ideas; select low sodium, diabetes/ carb controlled when searching for ideas.   [email or web address] has more ideas for mediterranean style eating.   Include about 45-60grams carb with meals and choose mostly whole grains and high fiber foods to minimize effect on blood sugar.

## 2021-10-08 ENCOUNTER — Other Ambulatory Visit: Payer: Self-pay | Admitting: Pulmonary Disease

## 2021-10-08 DIAGNOSIS — R0602 Shortness of breath: Secondary | ICD-10-CM

## 2021-10-09 ENCOUNTER — Ambulatory Visit
Admission: RE | Admit: 2021-10-09 | Discharge: 2021-10-09 | Disposition: A | Discharge status: Home / Self Care | Payer: Commercial Managed Care - PPO | Source: Ambulatory Visit | Attending: Pulmonary Disease | Admitting: Pulmonary Disease

## 2021-10-09 DIAGNOSIS — R0602 Shortness of breath: Secondary | ICD-10-CM

## 2021-10-09 MED ORDER — IOPAMIDOL (ISOVUE-370) INJECTION 76%
75.0000 mL | Freq: Once | INTRAVENOUS | Status: AC | PRN
  Administered 2021-10-09: 75 mL via INTRAVENOUS

## 2021-10-29 ENCOUNTER — Encounter: Payer: Self-pay | Admitting: Gastroenterology

## 2021-10-29 NOTE — H&P (Incomplete)
Pre-Procedure H&P   Patient ID: [name] is a 51 y.o. male.  Gastroenterology Provider: Annamaria Helling, DO  Referring Provider: Octavia Bruckner, PA PCP: Latanya Maudlin, NP  Date: 10/29/2021  HPI Mr. Khalel Alms Skillen is a 51 y.o. male who presents today for Colonoscopy for screening.  Patient undergoing screening colonoscopy.  Last performed 10 years ago at [facility] in the setting of perirectal abscess and was performed to rule out Crohn's disease.  This was found to be negative with recommended 10-year follow-up. Patient suffered a provoked pulmonary embolus after COVID-19 infection and was on Eliquis, however, this has been stopped since March.  Rated intermediate risk from his cardiology provider. No family history of colorectal cancer or colon polyps.  Has a daily formed bowel movement without melena hematochezia constipation or diarrhea. Most recent lab work: A1c 6.4 creatinine 1.0 hemoglobin 13.7 MCV 84 platelets 270,000   Past Medical History:  Diagnosis Date   COVID-19 12/19/2019   Diagnosed at [facility] on 12/19/2019   Diabetes mellitus without complication ([place])    History of pneumonia    History of tobacco use    Hyperlipidemia    Hypertension    Obesity    Pulmonary embolism ([place])     Past Surgical History:  Procedure Laterality Date   INCISION AND DRAINAGE     buttock    Family History No h/o GI disease or malignancy  Review of Systems  Constitutional:  Negative for activity change, appetite change, chills, diaphoresis, fatigue, fever and unexpected weight change.  HENT:  Negative for trouble swallowing and voice change.   Respiratory:  Negative for shortness of breath and wheezing.   Cardiovascular:  Negative for chest pain, palpitations and leg swelling.  Gastrointestinal:  Negative for abdominal distention, abdominal pain, anal bleeding, blood in stool, constipation, diarrhea, nausea and vomiting.  Musculoskeletal:  Negative for arthralgias and  myalgias.  Skin:  Negative for color change and pallor.  Neurological:  Negative for dizziness, syncope and weakness.  Psychiatric/Behavioral:  Negative for confusion. The patient is not nervous/anxious.   All other systems reviewed and are negative.    Medications No current facility-administered medications on file prior to encounter.   Current Outpatient Medications on File Prior to Encounter  Medication Sig Dispense Refill   Dapagliflozin-metFORMIN HCl ER (XIGDUO XR) 08-998 MG TB24 Take by mouth daily.     hydrALAZINE (APRESOLINE) 10 MG tablet Take 10 mg by mouth 3 (three) times daily.     spironolactone-hydrochlorothiazide (ALDACTAZIDE) 25-25 MG tablet Take 1 tablet by mouth daily.     tadalafil (CIALIS) 20 MG tablet Take 20 mg by mouth daily as needed for erectile dysfunction.     tirzepatide (MOUNJARO) 7.5 MG/0.5ML Pen Inject 7.5 mg into the skin once a week.     ACCU-CHEK GUIDE test strip      Accu-Chek Softclix Lancets lancets SMARTSIG:Topical     albuterol (VENTOLIN HFA) 108 (90 Base) MCG/ACT inhaler Inhale 2 puffs into the lungs every 6 (six) hours. 6.7 g 0   amLODipine-benazepril (LOTREL) 10-40 MG capsule Take 1 capsule by mouth daily.     apixaban (ELIQUIS) 5 MG TABS tablet Take 2 tablets (10 mg total) by mouth 2 (two) times daily. 14 tablet 0   ascorbic acid (VITAMIN C) 500 MG tablet Take 1 tablet (500 mg total) by mouth daily. 30 tablet 0   atorvastatin (LIPITOR) 20 MG tablet Take 1 tablet (20 mg total) by mouth daily. 30 tablet 0  Blood Glucose Monitoring Suppl (ACCU-CHEK GUIDE) w/Device KIT      fluconazole (DIFLUCAN) 100 MG tablet Take 1 tablet (100 mg total) by mouth daily at 6 PM. 4 tablet 0   insulin aspart (NOVOLOG FLEXPEN) 100 UNIT/ML FlexPen Inject 10 Units into the skin 3 (three) times daily with meals. 15 mL 0   insulin detemir (LEVEMIR) 100 unit/ml SOLN Inject 30 Units into the skin daily. 12 mL 0   Insulin Pen Needle 31G X 8 MM MISC Use as directed 200 each 0    linagliptin (TRADJENTA) 5 MG TABS tablet Take 1 tablet (5 mg total) by mouth daily. 30 tablet 0   magic mouthwash w/lidocaine SOLN Take 5 mLs by mouth 4 (four) times daily as needed for mouth pain. 120 mL 0   menthol-cetylpyridinium (CEPACOL) 3 MG lozenge Take 1 lozenge (3 mg total) by mouth as needed for sore throat. 100 tablet 0   metoprolol tartrate (LOPRESSOR) 25 MG tablet Take 0.5 tablets (12.5 mg total) by mouth 2 (two) times daily. 60 tablet 0   Multiple Vitamin (MULTIVITAMIN WITH MINERALS) TABS tablet Take 1 tablet by mouth daily. 30 tablet 0   nystatin (MYCOSTATIN) 100000 UNIT/ML suspension Take 5 mLs (500,000 Units total) by mouth 4 (four) times daily. 60 mL 0   ondansetron (ZOFRAN) 4 MG tablet Take 1 tablet (4 mg total) by mouth every 6 (six) hours as needed for nausea. 20 tablet 0   oxymetazoline (AFRIN) 0.05 % nasal spray Place 1 spray into both nostrils 2 (two) times daily. 30 mL 0   phenol (CHLORASEPTIC) 1.4 % LIQD Use as directed 1 spray in the mouth or throat as needed for throat irritation / pain. 20 mL 0   sodium chloride (OCEAN) 0.65 % SOLN nasal spray Place 1 spray into both nostrils as needed for congestion. 15 mL 0   traZODone (DESYREL) 50 MG tablet Take by mouth.     Vitamin D3 (VITAMIN D) 25 MCG tablet Take 1 tablet (1,000 Units total) by mouth daily. 30 tablet 0   zinc sulfate 220 (50 Zn) MG capsule Take 1 capsule (220 mg total) by mouth daily. 30 capsule 0   [DISCONTINUED] losartan (COZAAR) 50 MG tablet Take 1 tablet (50 mg total) by mouth daily. 90 tablet 0   [DISCONTINUED] rosuvastatin (CRESTOR) 5 MG tablet Take 1 tablet (5 mg total) by mouth at bedtime. 90 tablet 0   [DISCONTINUED] sitaGLIPtin-metformin (JANUMET) 50-500 MG tablet Take 1 tablet by mouth 2 (two) times daily with a meal. 180 tablet 0    Pertinent medications related to GI and procedure were reviewed by me with the patient prior to the procedure  No current facility-administered medications for this  encounter.  Current Outpatient Medications:    Dapagliflozin-metFORMIN HCl ER (XIGDUO XR) 08-998 MG TB24, Take by mouth daily., Disp: , Rfl:    hydrALAZINE (APRESOLINE) 10 MG tablet, Take 10 mg by mouth 3 (three) times daily., Disp: , Rfl:    spironolactone-hydrochlorothiazide (ALDACTAZIDE) 25-25 MG tablet, Take 1 tablet by mouth daily., Disp: , Rfl:    tadalafil (CIALIS) 20 MG tablet, Take 20 mg by mouth daily as needed for erectile dysfunction., Disp: , Rfl:    tirzepatide (MOUNJARO) 7.5 MG/0.5ML Pen, Inject 7.5 mg into the skin once a week., Disp: , Rfl:    ACCU-CHEK GUIDE test strip, , Disp: , Rfl:    Accu-Chek Softclix Lancets lancets, SMARTSIG:Topical, Disp: , Rfl:    albuterol (VENTOLIN HFA) 108 (90 Base) MCG/ACT inhaler,  Inhale 2 puffs into the lungs every 6 (six) hours., Disp: 6.7 g, Rfl: 0   amLODipine-benazepril (LOTREL) 10-40 MG capsule, Take 1 capsule by mouth daily., Disp: , Rfl:    apixaban (ELIQUIS) 5 MG TABS tablet, Take 2 tablets (10 mg total) by mouth 2 (two) times daily., Disp: 14 tablet, Rfl: 0   ascorbic acid (VITAMIN C) 500 MG tablet, Take 1 tablet (500 mg total) by mouth daily., Disp: 30 tablet, Rfl: 0   atorvastatin (LIPITOR) 20 MG tablet, Take 1 tablet (20 mg total) by mouth daily., Disp: 30 tablet, Rfl: 0   Blood Glucose Monitoring Suppl (ACCU-CHEK GUIDE) w/Device KIT, , Disp: , Rfl:    fluconazole (DIFLUCAN) 100 MG tablet, Take 1 tablet (100 mg total) by mouth daily at 6 PM., Disp: 4 tablet, Rfl: 0   insulin aspart (NOVOLOG FLEXPEN) 100 UNIT/ML FlexPen, Inject 10 Units into the skin 3 (three) times daily with meals., Disp: 15 mL, Rfl: 0   insulin detemir (LEVEMIR) 100 unit/ml SOLN, Inject 30 Units into the skin daily., Disp: 12 mL, Rfl: 0   Insulin Pen Needle 31G X 8 MM MISC, Use as directed, Disp: 200 each, Rfl: 0   linagliptin (TRADJENTA) 5 MG TABS tablet, Take 1 tablet (5 mg total) by mouth daily., Disp: 30 tablet, Rfl: 0   magic mouthwash w/lidocaine SOLN, Take 5  mLs by mouth 4 (four) times daily as needed for mouth pain., Disp: 120 mL, Rfl: 0   menthol-cetylpyridinium (CEPACOL) 3 MG lozenge, Take 1 lozenge (3 mg total) by mouth as needed for sore throat., Disp: 100 tablet, Rfl: 0   metoprolol tartrate (LOPRESSOR) 25 MG tablet, Take 0.5 tablets (12.5 mg total) by mouth 2 (two) times daily., Disp: 60 tablet, Rfl: 0   Multiple Vitamin (MULTIVITAMIN WITH MINERALS) TABS tablet, Take 1 tablet by mouth daily., Disp: 30 tablet, Rfl: 0   nystatin (MYCOSTATIN) 100000 UNIT/ML suspension, Take 5 mLs (500,000 Units total) by mouth 4 (four) times daily., Disp: 60 mL, Rfl: 0   ondansetron (ZOFRAN) 4 MG tablet, Take 1 tablet (4 mg total) by mouth every 6 (six) hours as needed for nausea., Disp: 20 tablet, Rfl: 0   oxymetazoline (AFRIN) 0.05 % nasal spray, Place 1 spray into both nostrils 2 (two) times daily., Disp: 30 mL, Rfl: 0   phenol (CHLORASEPTIC) 1.4 % LIQD, Use as directed 1 spray in the mouth or throat as needed for throat irritation / pain., Disp: 20 mL, Rfl: 0   sodium chloride (OCEAN) 0.65 % SOLN nasal spray, Place 1 spray into both nostrils as needed for congestion., Disp: 15 mL, Rfl: 0   traZODone (DESYREL) 50 MG tablet, Take by mouth., Disp: , Rfl:    Vitamin D3 (VITAMIN D) 25 MCG tablet, Take 1 tablet (1,000 Units total) by mouth daily., Disp: 30 tablet, Rfl: 0   zinc sulfate 220 (50 Zn) MG capsule, Take 1 capsule (220 mg total) by mouth daily., Disp: 30 capsule, Rfl: 0      No Known Allergies Allergies were reviewed by me prior to the procedure  Objective   There is no height or weight on file to calculate BMI. There were no vitals filed for this visit.  *** Physical Exam Vitals and nursing note reviewed.  Constitutional:      General: He is not in acute distress.    Appearance: Normal appearance. He is not ill-appearing, toxic-appearing or diaphoretic.  HENT:     Head: Normocephalic and atraumatic.  Nose: Nose normal.     Mouth/Throat:      Mouth: Mucous membranes are moist.     Pharynx: Oropharynx is clear.  Eyes:     General: No scleral icterus.    Extraocular Movements: Extraocular movements intact.  Cardiovascular:     Rate and Rhythm: Normal rate and regular rhythm.     Heart sounds: Normal heart sounds. No murmur heard.    No friction rub. No gallop.  Pulmonary:     Effort: Pulmonary effort is normal. No respiratory distress.     Breath sounds: Normal breath sounds. No wheezing, rhonchi or rales.  Abdominal:     General: Bowel sounds are normal. There is no distension.     Palpations: Abdomen is soft.     Tenderness: There is no abdominal tenderness. There is no guarding or rebound.  Musculoskeletal:     Cervical back: Neck supple.     Right lower leg: No edema.     Left lower leg: No edema.  Skin:    General: Skin is warm and dry.     Coloration: Skin is not jaundiced or pale.  Neurological:     General: No focal deficit present.     Mental Status: He is alert and oriented to person, place, and time. Mental status is at baseline.  Psychiatric:        Mood and Affect: Mood normal.        Behavior: Behavior normal.        Thought Content: Thought content normal.        Judgment: Judgment normal.      Assessment:  Mr. XENG KUCHER is a 51 y.o. male  who presents today for Colonoscopy for screening.  Plan:  Colonoscopy with possible intervention today  Colonoscopy with possible biopsy, control of bleeding, polypectomy, and interventions as necessary has been discussed with the patient/patient representative. Informed consent was obtained from the patient/patient representative after explaining the indication, nature, and risks of the procedure including but not limited to death, bleeding, perforation, missed neoplasm/lesions, cardiorespiratory compromise, and reaction to medications. Opportunity for questions was given and appropriate answers were provided. Patient/patient representative has verbalized  understanding is amenable to undergoing the procedure.   Annamaria Helling, DO  [facility]  Portions of the record may have been created with voice recognition software. Occasional wrong-word or 'sound-a-like' substitutions may have occurred due to the inherent limitations of voice recognition software.  Read the chart carefully and recognize, using context, where substitutions may have occurred.

## 2021-10-30 ENCOUNTER — Ambulatory Visit
Admission: RE | Admit: 2021-10-30 | Payer: Commercial Managed Care - PPO | Source: Ambulatory Visit | Admitting: Gastroenterology

## 2021-10-30 ENCOUNTER — Encounter: Admission: RE | Payer: Self-pay | Source: Ambulatory Visit

## 2021-10-30 ENCOUNTER — Encounter: Payer: Self-pay | Admitting: Certified Registered"

## 2021-10-30 HISTORY — DX: Personal history of pneumonia (recurrent): Z87.01

## 2021-10-30 HISTORY — DX: Other pulmonary embolism without acute cor pulmonale: I26.99

## 2021-10-30 SURGERY — COLONOSCOPY WITH PROPOFOL
Anesthesia: General

## 2021-10-30 MED ORDER — PROPOFOL 1000 MG/100ML IV EMUL
INTRAVENOUS | Status: AC
  Filled 2021-10-30: qty 100

## 2023-10-13 IMAGING — CT CT ANGIO CHEST
1 of 2 series · 18 of 30 positions shown · IV contrast (agent unspecified)
Comparison: None Available.

CLINICAL DATA: Shortness of breath

EXAM:
CT ANGIOGRAPHY CHEST WITH CONTRAST
TECHNIQUE: Multidetector CT imaging of the chest was performed using the
standard protocol during bolus administration of intravenous
contrast. Multiplanar CT image reconstructions and MIPs were
obtained to evaluate the vascular anatomy.

[Series 6: pe thins · axial · 0.85mm/px · z∈[-359,-77]mm · 18 of 318 slices shown]
[im 18/318  lung]
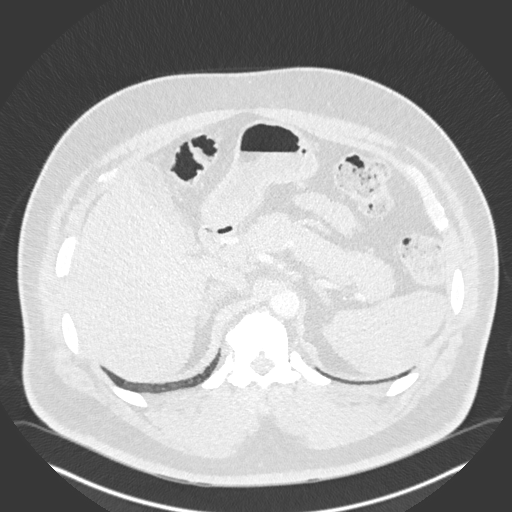
[im 36/318  mediastinal]
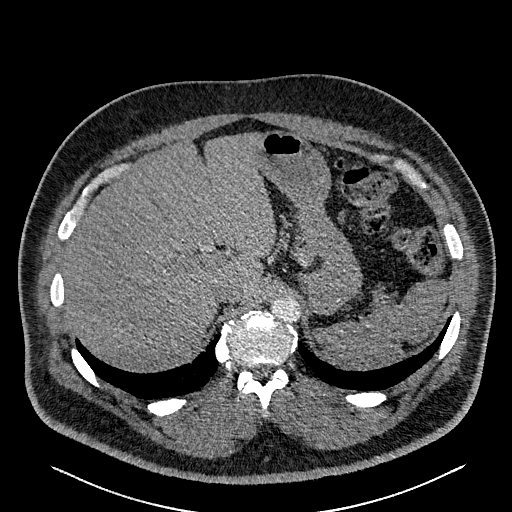
[im 53/318  lung]
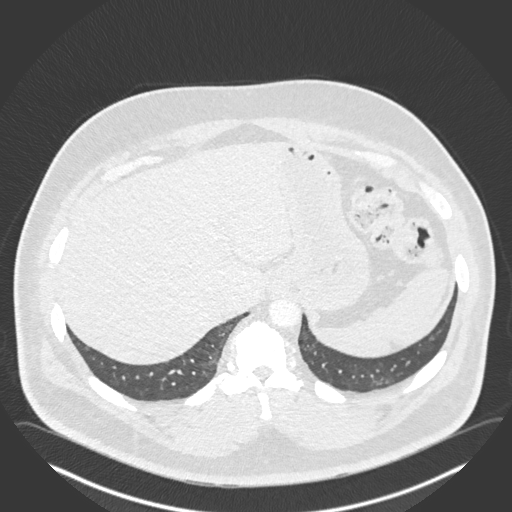
[im 71/318  mediastinal]
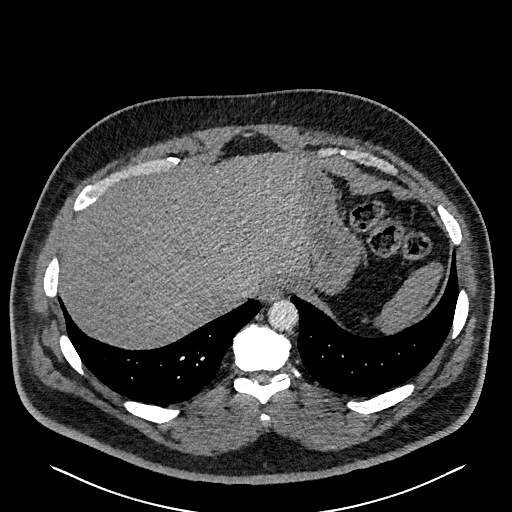
[im 89/318  lung]
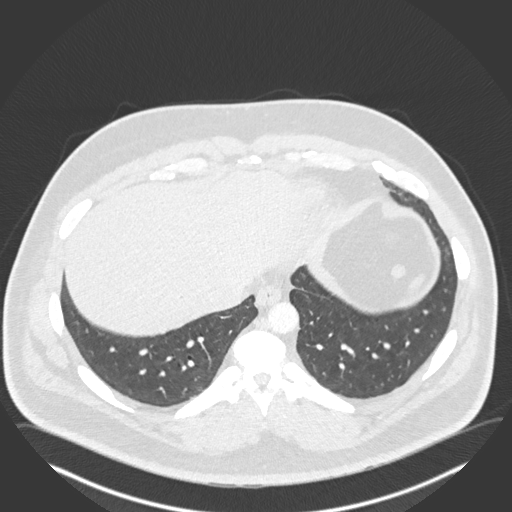
[im 106/318  mediastinal]
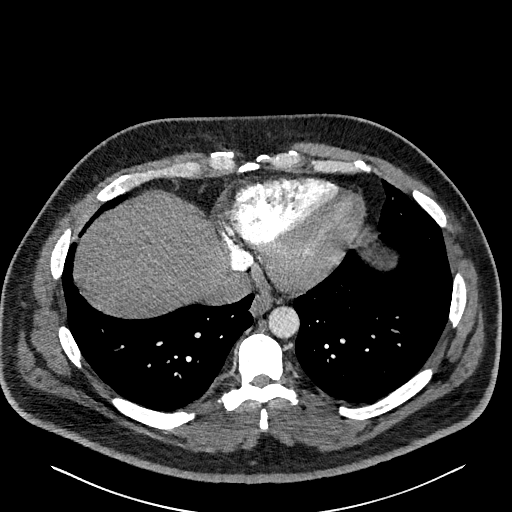
[im 124/318  lung]
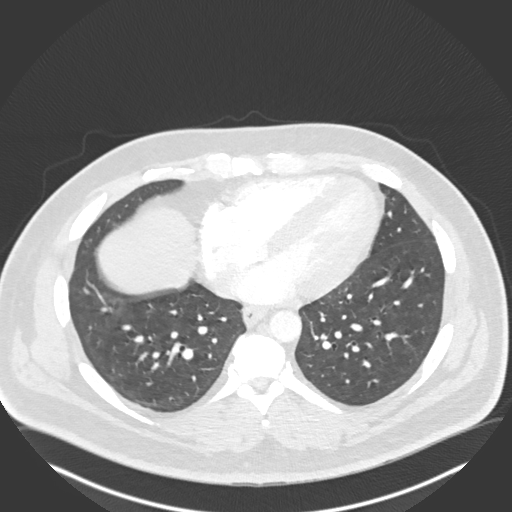
[im 141/318  mediastinal]
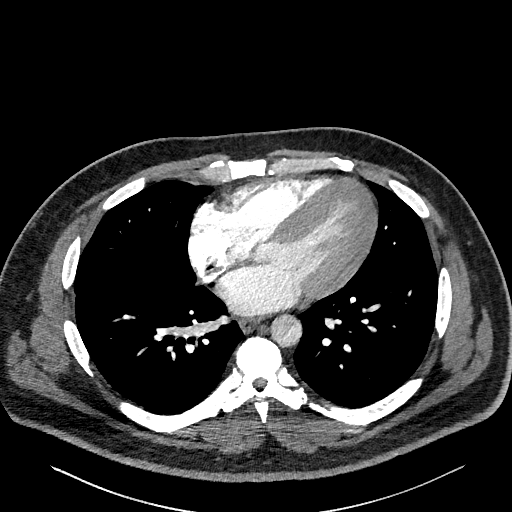
[im 150/318  lung]
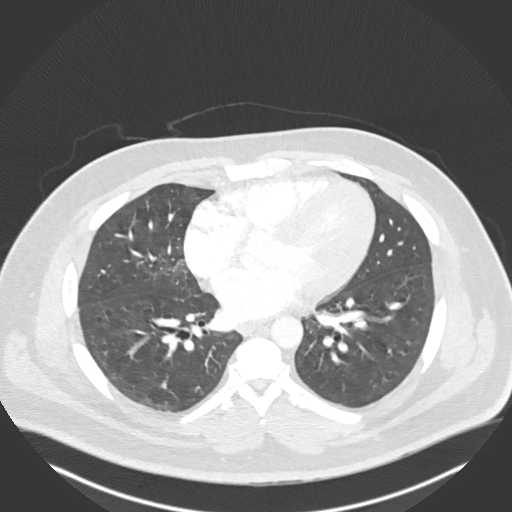
[im 159/318  mediastinal]
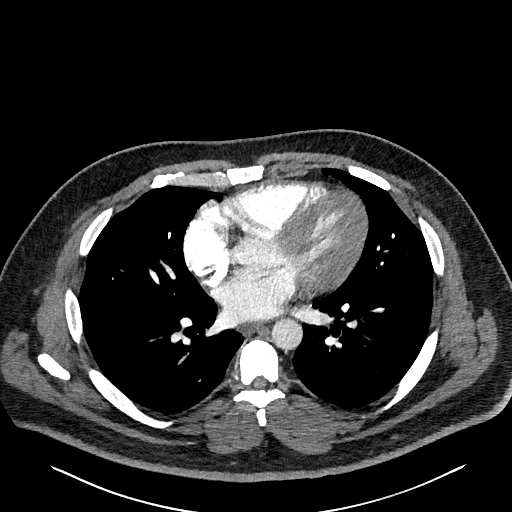
[im 177/318  lung]
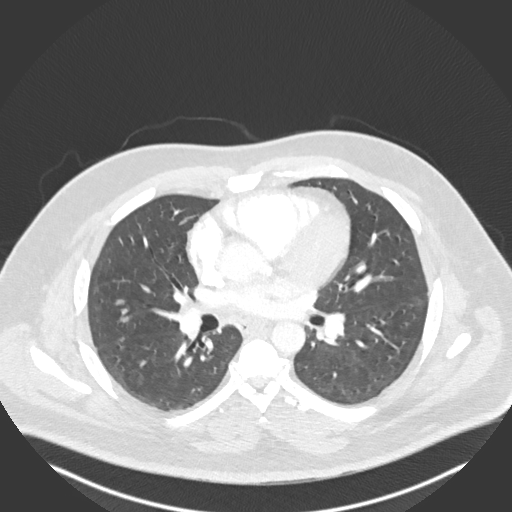
[im 194/318  mediastinal]
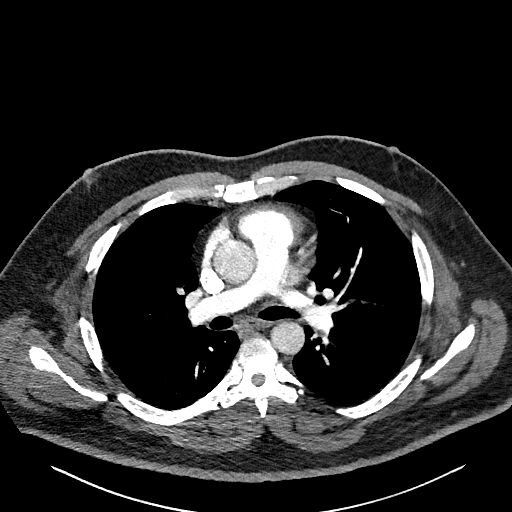
[im 212/318  lung]
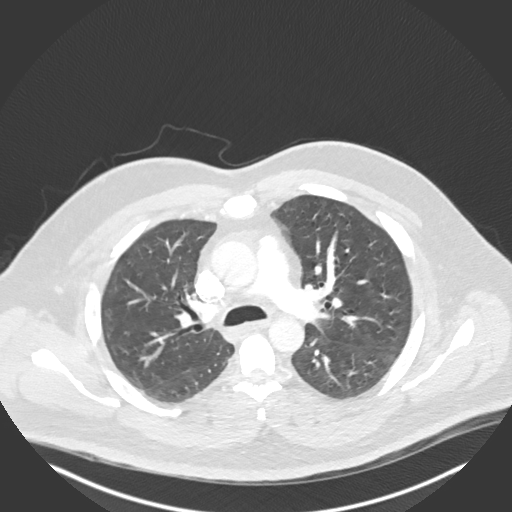
[im 229/318  mediastinal]
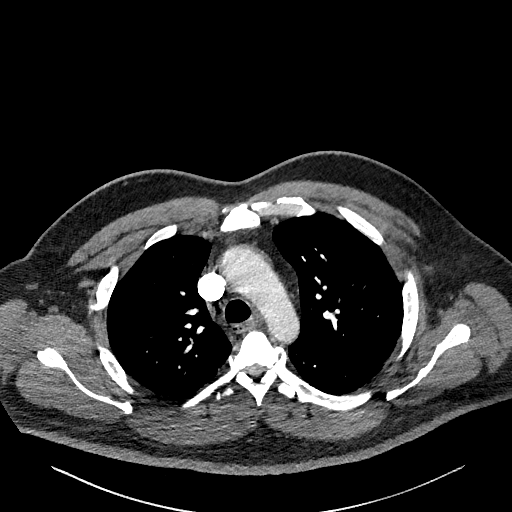
[im 247/318  lung]
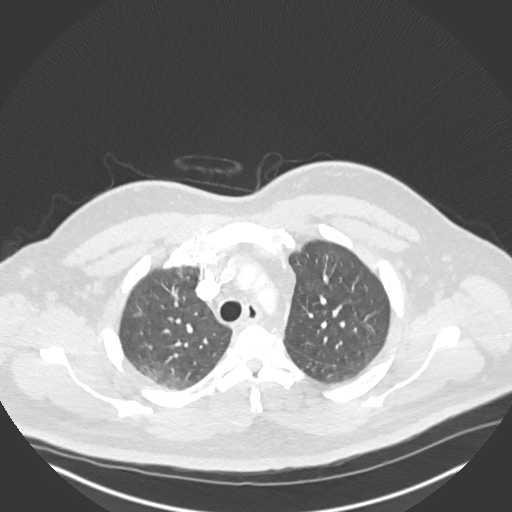
[im 265/318  mediastinal]
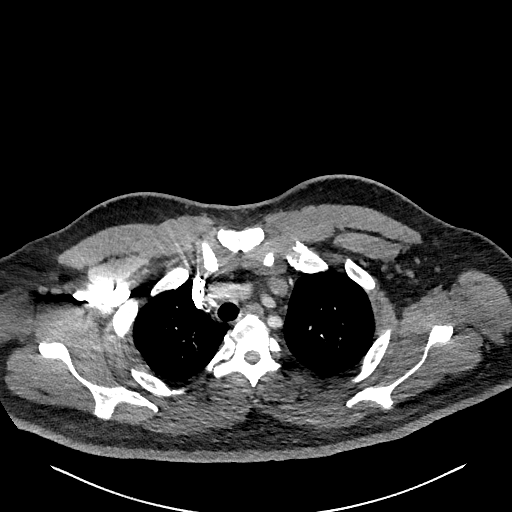
[im 282/318  lung]
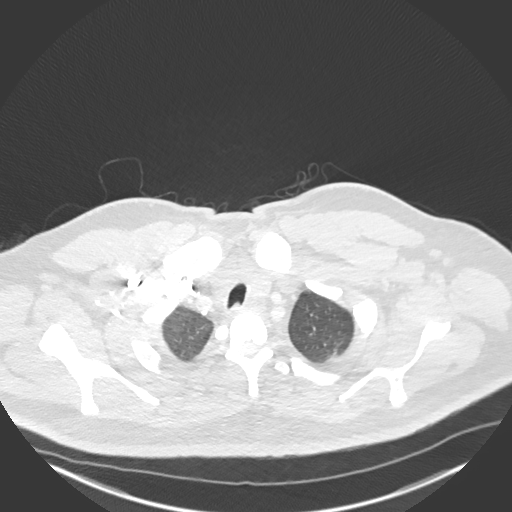
[im 300/318  mediastinal]
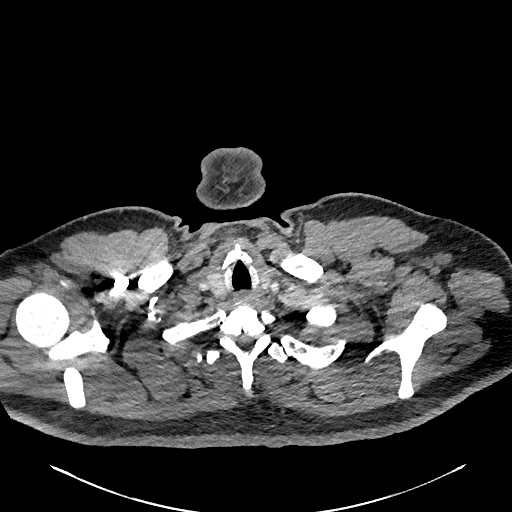

[18 of 30 positions shown; findings below may reference images not displayed]

RADIATION DOSE REDUCTION: This exam was performed according to the
departmental dose-optimization program which includes automated
exposure control, adjustment of the mA and/or kV according to
patient size and/or use of iterative reconstruction technique.

CONTRAST:  75mL N433JH-5MF IOPAMIDOL (N433JH-5MF) INJECTION 76%

Creatinine was obtained on site at [HOSPITAL] at [HOSPITAL]

Results: Creatinine 1.2 mg/dL.
FINDINGS: Cardiovascular: Satisfactory opacification of the pulmonary arteries
to the segmental level. No evidence of pulmonary embolism. Normal
heart size. No pericardial effusion. Coronary artery calcification.
Thoracic aorta is normal in caliber.

Mediastinum/Nodes: No enlarged nodes. Thyroid and esophagus are
unremarkable.

Lungs/Pleura: Minimal patchy ground-glass density bilaterally
probably reflects atelectasis with imaging in expiration. No pleural
effusion or pneumothorax.

Upper Abdomen: No acute abnormality.

Musculoskeletal: No acute osseous abnormality.

Review of the MIP images confirms the above findings.
IMPRESSION: No evidence of acute pulmonary embolism or other acute abnormality.
